# Patient Record
Sex: Female | Born: 1955
Health system: Southern US, Community
[De-identification: ages and names within clinical notes are randomized; demographics above are authoritative.]

## PROBLEM LIST (undated history)

## (undated) DIAGNOSIS — B9681 Helicobacter pylori [H. pylori] as the cause of diseases classified elsewhere: Secondary | ICD-10-CM

## (undated) DIAGNOSIS — M539 Dorsopathy, unspecified: Secondary | ICD-10-CM

## (undated) DIAGNOSIS — N200 Calculus of kidney: Secondary | ICD-10-CM

## (undated) DIAGNOSIS — K279 Peptic ulcer, site unspecified, unspecified as acute or chronic, without hemorrhage or perforation: Secondary | ICD-10-CM

## (undated) HISTORY — PX: FINGER SURGERY: SHX640

## (undated) HISTORY — PX: LITHOTRIPSY: SUR834

---

## 2000-04-18 ENCOUNTER — Encounter (INDEPENDENT_AMBULATORY_CARE_PROVIDER_SITE_OTHER): Payer: Self-pay | Admitting: Specialist

## 2000-04-18 ENCOUNTER — Other Ambulatory Visit: Admission: RE | Admit: 2000-04-18 | Discharge: 2000-04-18 | Payer: Self-pay | Admitting: Obstetrics and Gynecology

## 2000-10-03 ENCOUNTER — Other Ambulatory Visit: Admission: RE | Admit: 2000-10-03 | Discharge: 2000-10-03 | Payer: Self-pay | Admitting: Obstetrics and Gynecology

## 2002-04-09 ENCOUNTER — Other Ambulatory Visit: Admission: RE | Admit: 2002-04-09 | Discharge: 2002-04-09 | Payer: Self-pay | Admitting: Gynecology

## 2007-01-22 ENCOUNTER — Emergency Department (HOSPITAL_COMMUNITY): Admission: EM | Admit: 2007-01-22 | Discharge: 2007-01-22 | Payer: Self-pay | Admitting: Emergency Medicine

## 2010-10-29 LAB — WET PREP, GENITAL
WBC, Wet Prep HPF POC: NONE SEEN
Yeast Wet Prep HPF POC: NONE SEEN

## 2010-10-29 LAB — URINALYSIS, ROUTINE W REFLEX MICROSCOPIC
Glucose, UA: NEGATIVE
Nitrite: NEGATIVE
Protein, ur: NEGATIVE
pH: 5.5

## 2013-02-15 ENCOUNTER — Ambulatory Visit: Payer: Self-pay | Admitting: Family Medicine

## 2013-02-15 DIAGNOSIS — Z0289 Encounter for other administrative examinations: Secondary | ICD-10-CM

## 2013-03-15 ENCOUNTER — Telehealth: Payer: Self-pay

## 2013-03-15 NOTE — Telephone Encounter (Signed)
Left message for call back Non identifiable  New Patient  No pertinent data 

## 2013-03-18 ENCOUNTER — Ambulatory Visit: Payer: Self-pay | Admitting: Family Medicine

## 2013-03-18 DIAGNOSIS — Z0289 Encounter for other administrative examinations: Secondary | ICD-10-CM

## 2013-03-26 NOTE — Telephone Encounter (Signed)
Patient was a no-show 

## 2014-01-01 ENCOUNTER — Ambulatory Visit: Payer: Self-pay | Attending: Physical Medicine and Rehabilitation | Admitting: Rehabilitation

## 2014-03-05 ENCOUNTER — Emergency Department (INDEPENDENT_AMBULATORY_CARE_PROVIDER_SITE_OTHER)
Admission: EM | Admit: 2014-03-05 | Discharge: 2014-03-05 | Disposition: A | Discharge status: Home / Self Care | Payer: Medicare Other | Source: Home / Self Care | Attending: Family Medicine | Admitting: Family Medicine

## 2014-03-05 ENCOUNTER — Encounter: Payer: Self-pay | Admitting: *Deleted

## 2014-03-05 DIAGNOSIS — L299 Pruritus, unspecified: Secondary | ICD-10-CM

## 2014-03-05 HISTORY — DX: Dorsopathy, unspecified: M53.9

## 2014-03-05 MED ORDER — DERMA-SMOOTHE/FS SCALP 0.01 % EX OIL
TOPICAL_OIL | CUTANEOUS | Status: DC
Start: 2014-03-05 — End: 2016-05-10

## 2014-03-05 MED ORDER — SPINOSAD 0.9 % EX SUSP: 1.0000 "application " | Freq: Once | CUTANEOUS | Status: DC

## 2014-03-05 NOTE — Discharge Instructions (Signed)
Apply spinosad to dry hair to completely cover scalp and hair.  Leave on for 10 minutes then rinse completely with warm water.

## 2014-03-05 NOTE — ED Provider Notes (Signed)
CSN: [id number]     Arrival date & time 03/05/14  1318 History   First MD Initiated Contact with Patient 03/05/14 1329     Chief Complaint  Patient presents with  . Head Lice  . Fatigue      HPI Comments: Patient reports [place] and believes that she was exposed to bedbugs.  While staying at another motel in December 2015, she believes that she became infected with scabies and head lice.  She reports that her scabies and bedbug rashes have resolved but she continues to have scalp itching.  She has tried several head lice treatments but complains of continued scalp itching.  She also complains of persistent fatigue.  The history is provided by the patient.    Past Medical History  Diagnosis Date  . Back problem    Past Surgical History  Procedure Laterality Date  . Cesarean section    . Finger surgery     Family History  Problem Relation Age of Onset  . Cancer Mother     pancreatic    History  Substance Use Topics  . Smoking status: Never Smoker   . Smokeless tobacco: Not on file  . Alcohol Use: No   OB History    No data available     Review of Systems  Constitutional: Positive for activity change and fatigue. Negative for fever, chills and unexpected weight change.  HENT:       Scalp itching  Eyes: Negative.   Respiratory: Negative.   Cardiovascular: Negative.   Gastrointestinal: Positive for nausea. Negative for abdominal pain.  Genitourinary: Negative for dysuria, frequency, flank pain and difficulty urinating.  Musculoskeletal: Negative for arthralgias.  Neurological: Positive for light-headedness and headaches. Negative for seizures, syncope, facial asymmetry, speech difficulty and numbness.    Allergies  Augmentin; Biaxin; and Keflet  Home Medications   Prior to Admission medications   Medication Sig Start Date End Date Taking? Authorizing Provider  Fluocinolone Acetonide (DERMA-SMOOTHE/FS SCALP) 0.01 % OIL  Apply to scalp at bedtime; wash scalp and hair next morning.  Use for 3 to 5 days as needed. 03/05/14   Lattie Haw, MD  Spinosad 0.9 % SUSP Apply 1 application topically once. Use as directed. 03/05/14   Lattie Haw, MD   BP 146/74 mmHg  Pulse 74  Temp(Src) 98.3 F (36.8 C) (Oral)  Resp 16  Ht  (1.651 m)  Wt 111 lb (50.349 kg)  BMI 18.47 kg/m2  SpO2 98% Physical Exam Nursing notes and Vital Signs reviewed. Appearance:  Patient appears stated age, and in no acute distress Scalp:  No nits or lice identified.  No scalp lesions or inflammation Eyes:  Pupils are equal, round, and reactive to light and accomodation.  Extraocular movement is intact.  Conjunctivae are not inflamed  Ears:  Canals normal.  Tympanic membranes normal.  Nose:  Normal turbinates.  No sinus tenderness.    Pharynx:  Normal Neck:  Supple.  No adenopathy  Lungs:  Clear to auscultation.  Breath sounds are equal.  Heart:  Regular rate and rhythm without murmurs, rubs, or gallops.  Abdomen:  Nontender without masses or hepatosplenomegaly.  Bowel sounds are present.  No CVA or flank tenderness.  Extremities:  No edema.  No calf tenderness Skin:  No rash present.   ED Course  Procedures none     MDM   1. Pruritus of scalp; ?pedulosis capitis  One application of spinosad.  Derma-smoothe/FS for itching Apply spinosad to dry hair to completely cover scalp and hair.  Leave on for 10 minutes then rinse completely with warm water. Followup with Family Doctor if not improved in about 10 days. Followup with Family Doctor for evaluation of fatigue and abdominal pain    Lattie HawStephen A Lizania Bouchard, MD 03/09/14 [phone]

## 2014-03-05 NOTE — ED Notes (Signed)
Pt reports that she got bed bugs from a motel 11/15', and lice and scabies from a different motel 12/15'. She c/o extreme fatigue since then and yellow nasal d/c. She believes that the bed bugs and scabies have resolved, but that she still has lice. She also c/o LT side abd pain x 3wks.

## 2014-03-11 ENCOUNTER — Ambulatory Visit: Payer: Medicare Other | Admitting: Family Medicine

## 2014-03-11 DIAGNOSIS — Z0289 Encounter for other administrative examinations: Secondary | ICD-10-CM

## 2014-03-18 ENCOUNTER — Ambulatory Visit: Payer: Medicare Other | Admitting: Family Medicine

## 2014-03-18 DIAGNOSIS — Z0289 Encounter for other administrative examinations: Secondary | ICD-10-CM

## 2014-08-25 HISTORY — PX: HAND SURGERY: SHX662

## 2015-03-14 ENCOUNTER — Emergency Department (HOSPITAL_BASED_OUTPATIENT_CLINIC_OR_DEPARTMENT_OTHER)
Admission: EM | Admit: 2015-03-14 | Discharge: 2015-03-14 | Disposition: A | Discharge status: Home / Self Care | Payer: Medicare Other | Attending: Emergency Medicine | Admitting: Emergency Medicine

## 2015-03-14 ENCOUNTER — Encounter (HOSPITAL_BASED_OUTPATIENT_CLINIC_OR_DEPARTMENT_OTHER): Payer: Self-pay | Admitting: *Deleted

## 2015-03-14 ENCOUNTER — Emergency Department (HOSPITAL_BASED_OUTPATIENT_CLINIC_OR_DEPARTMENT_OTHER): Payer: Medicare Other

## 2015-03-14 DIAGNOSIS — R51 Headache: Secondary | ICD-10-CM | POA: Insufficient documentation

## 2015-03-14 DIAGNOSIS — R531 Weakness: Secondary | ICD-10-CM | POA: Diagnosis not present

## 2015-03-14 DIAGNOSIS — M2550 Pain in unspecified joint: Secondary | ICD-10-CM | POA: Diagnosis not present

## 2015-03-14 DIAGNOSIS — R42 Dizziness and giddiness: Secondary | ICD-10-CM | POA: Insufficient documentation

## 2015-03-14 DIAGNOSIS — R519 Headache, unspecified: Secondary | ICD-10-CM

## 2015-03-14 LAB — BASIC METABOLIC PANEL
Anion gap: 7 (ref 5–15)
BUN: 14 mg/dL (ref 6–20)
CO2: 29 mmol/L (ref 22–32)
CREATININE: 0.57 mg/dL (ref 0.44–1.00)
Calcium: 9 mg/dL (ref 8.9–10.3)
Chloride: 104 mmol/L (ref 101–111)
Glucose, Bld: 101 mg/dL — ABNORMAL HIGH (ref 65–99)
POTASSIUM: 4 mmol/L (ref 3.5–5.1)
SODIUM: 140 mmol/L (ref 135–145)

## 2015-03-14 LAB — URINALYSIS, ROUTINE W REFLEX MICROSCOPIC
BILIRUBIN URINE: NEGATIVE
GLUCOSE, UA: NEGATIVE mg/dL
KETONES UR: NEGATIVE mg/dL
LEUKOCYTES UA: NEGATIVE
NITRITE: NEGATIVE
PH: 6.5 (ref 5.0–8.0)
Protein, ur: NEGATIVE mg/dL
Specific Gravity, Urine: 1.015 (ref 1.005–1.030)

## 2015-03-14 LAB — CBC
HCT: 39.9 % (ref 36.0–46.0)
Hemoglobin: 12.9 g/dL (ref 12.0–15.0)
MCH: 28.3 pg (ref 26.0–34.0)
MCHC: 32.3 g/dL (ref 30.0–36.0)
MCV: 87.5 fL (ref 78.0–100.0)
PLATELETS: 257 10*3/uL (ref 150–400)
RBC: 4.56 MIL/uL (ref 3.87–5.11)
RDW: 13.5 % (ref 11.5–15.5)
WBC: 4 10*3/uL (ref 4.0–10.5)

## 2015-03-14 LAB — CBG MONITORING, ED: Glucose-Capillary: 82 mg/dL (ref 65–99)

## 2015-03-14 LAB — URINE MICROSCOPIC-ADD ON
Squamous Epithelial / LPF: NONE SEEN
WBC, UA: NONE SEEN WBC/hpf (ref 0–5)

## 2015-03-14 MED ORDER — MECLIZINE HCL 25 MG PO TABS: 25.0000 mg | ORAL_TABLET | Freq: Three times a day (TID) | ORAL | Status: DC | PRN

## 2015-03-14 MED ORDER — HYDROCODONE-ACETAMINOPHEN 5-325 MG PO TABS: 1.0000 | ORAL_TABLET | Freq: Four times a day (QID) | ORAL | Status: DC | PRN

## 2015-03-14 NOTE — ED Notes (Signed)
FAST assessment: negative 

## 2015-03-14 NOTE — ED Provider Notes (Signed)
CSN: [id number]     Arrival date & time 03/14/15  0805 History   First MD Initiated Contact with Patient 03/14/15 0815     Chief Complaint  Patient presents with  . Dizziness     (Consider location/radiation/quality/duration/timing/severity/associated sxs/prior Treatment) Patient is a 60 y.o. female presenting with dizziness. The history is provided by the patient.  Dizziness Associated symptoms: headaches and weakness   Associated symptoms: no chest pain, no nausea, no shortness of breath and no vomiting    patient with a 4 day history of dizziness that's been getting a little bit worse associated with some mild vertigo. For head headache no fevers. Also some right arm weakness that's been ongoing for months but she's had surgery on that side. Patient with some concerns about possible stroke. FAST exam in triage was negative.  Past Medical History  Diagnosis Date  . Back problem    Past Surgical History  Procedure Laterality Date  . Cesarean section    . Finger surgery    . Hand surgery  august 2016   Family History  Problem Relation Age of Onset  . Cancer Mother     pancreatic    Social History  Substance Use Topics  . Smoking status: Never Smoker   . Smokeless tobacco: None  . Alcohol Use: No   OB History    No data available     Review of Systems  Constitutional: Negative for fever.  HENT: Negative for congestion.   Eyes: Negative for visual disturbance.  Respiratory: Negative for shortness of breath.   Cardiovascular: Negative for chest pain.  Gastrointestinal: Negative for nausea, vomiting and abdominal pain.  Genitourinary: Negative for dysuria.  Musculoskeletal: Positive for arthralgias.  Skin: Negative for rash.  Neurological: Positive for dizziness, weakness and headaches.  Hematological: Does not bruise/bleed easily.  Psychiatric/Behavioral: Negative for confusion.      Allergies  Augmentin; Biaxin; and Keflet  Home Medications   Prior to  Admission medications   Medication Sig Start Date End Date Taking? Authorizing Provider  Fluocinolone Acetonide (DERMA-SMOOTHE/FS SCALP) 0.01 % OIL Apply to scalp at bedtime; wash scalp and hair next morning.  Use for 3 to 5 days as needed. 03/05/14   Lattie Haw, MD  HYDROcodone-acetaminophen (NORCO/VICODIN) 5-325 MG tablet Take 1-2 tablets by mouth every 6 (six) hours as needed for moderate pain. 03/14/15   Vanetta Mulders, MD  meclizine (ANTIVERT) 25 MG tablet Take 1 tablet (25 mg total) by mouth 3 (three) times daily as needed for dizziness. 03/14/15   Vanetta Mulders, MD  Spinosad 0.9 % SUSP Apply 1 application topically once. Use as directed. 03/05/14   Lattie Haw, MD   BP 139/76 mmHg  Pulse 62  Temp(Src) 97.8 F (36.6 C) (Oral)  Resp 16  SpO2 100% Physical Exam  Constitutional: She is oriented to person, place, and time. She appears well-developed and well-nourished. No distress.  HENT:  Head: Normocephalic and atraumatic.  Right Ear: External ear normal.  Left Ear: External ear normal.  Mouth/Throat: Oropharynx is clear and moist. No oropharyngeal exudate.  Eyes: Conjunctivae and EOM are normal. Pupils are equal, round, and reactive to light.  Neck: Normal range of motion. Neck supple.  Cardiovascular: Normal rate, regular rhythm and normal heart sounds.   Pulmonary/Chest: Effort normal and breath sounds normal. No respiratory distress.  Abdominal: Soft. Bowel sounds are normal. There is no tenderness.  Musculoskeletal: Normal range of motion. She exhibits no edema.  Neurological: She is alert and  oriented to person, place, and time. No cranial nerve deficit. She exhibits normal muscle tone. Coordination normal.  Skin: Skin is warm. No rash noted.  Nursing note and vitals reviewed.   ED Course  Procedures (including critical care time) Labs Review Labs Reviewed  BASIC METABOLIC PANEL - Abnormal; Notable for the following:    Glucose, Bld 101 (*)    All other  components within normal limits  URINALYSIS, ROUTINE W REFLEX MICROSCOPIC (NOT AT [facility]) - Abnormal; Notable for the following:    Hgb urine dipstick TRACE (*)    All other components within normal limits  URINE MICROSCOPIC-ADD ON - Abnormal; Notable for the following:    Bacteria, UA FEW (*)    All other components within normal limits  CBC  CBG MONITORING, ED   Results for orders placed or performed during the hospital encounter of 03/14/15  Basic metabolic panel  Result Value Ref Range   Sodium 140 135 - 145 mmol/L   Potassium 4.0 3.5 - 5.1 mmol/L   Chloride 104 101 - 111 mmol/L   CO2 29 22 - 32 mmol/L   Glucose, Bld 101 (H) 65 - 99 mg/dL   BUN 14 6 - 20 mg/dL   Creatinine, Ser 4.09 0.44 - 1.00 mg/dL   Calcium 9.0 8.9 - 81.1 mg/dL   GFR calc non Af Amer >60 >60 mL/min   GFR calc Af Amer >60 >60 mL/min   Anion gap 7 5 - 15  CBC  Result Value Ref Range   WBC 4.0 4.0 - 10.5 K/uL   RBC 4.56 3.87 - 5.11 MIL/uL   Hemoglobin 12.9 12.0 - 15.0 g/dL   HCT 91.4 78.2 - 95.6 %   MCV 87.5 78.0 - 100.0 fL   MCH 28.3 26.0 - 34.0 pg   MCHC 32.3 30.0 - 36.0 g/dL   RDW 21.3 08.6 - 57.8 %   Platelets 257 150 - 400 K/uL  Urinalysis, Routine w reflex microscopic (not at [facility])  Result Value Ref Range   Color, Urine YELLOW YELLOW   APPearance CLEAR CLEAR   Specific Gravity, Urine 1.015 1.005 - 1.030   pH 6.5 5.0 - 8.0   Glucose, UA NEGATIVE NEGATIVE mg/dL   Hgb urine dipstick TRACE (A) NEGATIVE   Bilirubin Urine NEGATIVE NEGATIVE   Ketones, ur NEGATIVE NEGATIVE mg/dL   Protein, ur NEGATIVE NEGATIVE mg/dL   Nitrite NEGATIVE NEGATIVE   Leukocytes, UA NEGATIVE NEGATIVE  Urine microscopic-add on  Result Value Ref Range   Squamous Epithelial / LPF NONE SEEN NONE SEEN   WBC, UA NONE SEEN 0 - 5 WBC/hpf   RBC / HPF 0-5 0 - 5 RBC/hpf   Bacteria, UA FEW (A) NONE SEEN  CBG monitoring, ED  Result Value Ref Range   Glucose-Capillary 82 65 - 99 mg/dL     Imaging Review Dg Chest 2  View  03/14/2015  CLINICAL DATA:  Dizziness for 3 days EXAM: CHEST  2 VIEW COMPARISON:  01/06/2014 FINDINGS: Normal heart size. Clear lungs. No pneumothorax. No pleural effusion. Hyperaeration. Right humeral neck fracture is grossly healed with deformity. IMPRESSION: No active cardiopulmonary disease. Electronically Signed   By: Jolaine Click M.D.   On: 03/14/2015 09:04   Ct Head Wo Contrast  03/14/2015  CLINICAL DATA:  Pt complains of dizziness x 3 days, headache, some nausea No hx HTN EXAM: CT HEAD WITHOUT CONTRAST TECHNIQUE: Contiguous axial images were obtained from the base of the skull through the vertex without intravenous contrast.  COMPARISON:  None. FINDINGS: Ventricles are normal in size and configuration. There are no parenchymal masses or mass effect, no evidence of an infarct, no extra-axial masses or abnormal fluid collections and no intracranial hemorrhage. Visualized sinuses and mastoid air cells are clear. No skull lesion. IMPRESSION: Normal unenhanced CT scan of the brain. Electronically Signed   By:Amie Portlandmond M.D.   On: 03/14/2015 08:58   Mr Brain Wo Contrast  03/14/2015  CLINICAL DATA:  Dizziness. EXAM: MRI HEAD WITHOUT CONTRAST TECHNIQUE: Multiplanar, multiecho pulse sequences of the brain and surrounding structures were obtained without intravenous contrast. COMPARISON:  CT head 03/14/2015 FINDINGS: Negative for acute infarct. No significant chronic ischemic changes. Negative for intracranial hemorrhage. Negative for mass or edema. No shift of the midline structures. Ventricle size normal.  Cerebral volume normal. Craniocervical junction normal. Normal pituitary. Vessels of the base the brain are patent. Normal skull base. Paranasal sinuses clear.  Normal orbit. IMPRESSION: Negative Electronically Signed   By: Marlan Palau M.D.   On: 03/14/2015 10:52   I have personally reviewed and evaluated these images and lab results as part of my medical decision-making.   EKG  Interpretation   Date/Time:  Saturday March 14 2015 08:14:57 EST Ventricular Rate:  67 PR Interval:  132 QRS Duration: 85 QT Interval:  401 QTC Calculation: 423 R Axis:   72 Text Interpretation:  Sinus rhythm No previous ECGs available Confirmed by  Doriann Zuch  MD, Rodd Heft ([id number]) on 03/14/2015 8:18:07 AM      MDM   Final diagnoses:  Dizziness  Nonintractable headache, unspecified chronicity pattern, unspecified headache type    Patient with dizziness slight room spinning the past 4 days. Associated with some for head headache. No fevers no nausea no vomiting. Patient also with some weakness to her right arm which she's not sure is related to of recent surgery. That's been going on for several months. Patient was concerned about possible stroke.  Head CT was negative MRI of the brain is negative. Labs without any significant abnormalities. Will treat her symptoms with meclizine and a short the course of hydrocodone. Patient will follow-up with her regular doctor if symptoms do not improve.  Neuro exam was normal ear exam was normal.  Vanetta Mulders, MD 03/14/15 1124

## 2015-03-14 NOTE — Discharge Instructions (Signed)
Workup for the dizziness without any acute findings. MRI was negative. Trial of meclizine for the dizziness. And hydrocodone for the headache. If symptoms do not improve make an appointment to follow-up with your regular doctor. Return for any new or worse symptoms.

## 2015-03-14 NOTE — ED Notes (Signed)
Patient transported to MRI 

## 2015-03-14 NOTE — ED Notes (Signed)
MD at bedside. 

## 2015-03-14 NOTE — ED Notes (Signed)
Pt states she has been having some dizziness for the past few days

## 2015-03-14 NOTE — ED Notes (Signed)
Pt placed on cont cardiac monitor with int NBP and cont POX

## 2015-03-14 NOTE — ED Notes (Signed)
Patient transported to X-ray via stretcher, sr x 2 up 

## 2015-03-26 ENCOUNTER — Emergency Department (HOSPITAL_BASED_OUTPATIENT_CLINIC_OR_DEPARTMENT_OTHER)
Admission: EM | Admit: 2015-03-26 | Discharge: 2015-03-27 | Disposition: A | Discharge status: Home / Self Care | Payer: Medicare Other | Attending: Emergency Medicine | Admitting: Emergency Medicine

## 2015-03-26 ENCOUNTER — Other Ambulatory Visit: Payer: Self-pay

## 2015-03-26 ENCOUNTER — Encounter (HOSPITAL_BASED_OUTPATIENT_CLINIC_OR_DEPARTMENT_OTHER): Payer: Self-pay | Admitting: Emergency Medicine

## 2015-03-26 ENCOUNTER — Emergency Department (HOSPITAL_BASED_OUTPATIENT_CLINIC_OR_DEPARTMENT_OTHER): Payer: Medicare Other

## 2015-03-26 DIAGNOSIS — Z79899 Other long term (current) drug therapy: Secondary | ICD-10-CM | POA: Insufficient documentation

## 2015-03-26 DIAGNOSIS — Z8739 Personal history of other diseases of the musculoskeletal system and connective tissue: Secondary | ICD-10-CM | POA: Diagnosis not present

## 2015-03-26 DIAGNOSIS — R079 Chest pain, unspecified: Secondary | ICD-10-CM | POA: Diagnosis not present

## 2015-03-26 DIAGNOSIS — R091 Pleurisy: Secondary | ICD-10-CM | POA: Diagnosis present

## 2015-03-26 NOTE — ED Notes (Signed)
Patient states that she started to have pain to her right chest yesterday. She went to an urgent care but the wait was too long. She reports that she gets this yearly and it is usually bronchitis.

## 2015-03-27 MED ORDER — AZITHROMYCIN 250 MG PO TABS: 250.0000 mg | ORAL_TABLET | Freq: Every day | ORAL | Status: DC

## 2015-03-27 MED ORDER — IBUPROFEN 800 MG PO TABS: 800.0000 mg | ORAL_TABLET | Freq: Three times a day (TID) | ORAL | Status: DC

## 2015-03-27 MED ORDER — RANITIDINE HCL 150 MG PO TABS: 150.0000 mg | ORAL_TABLET | Freq: Two times a day (BID) | ORAL | Status: DC

## 2015-03-27 NOTE — Discharge Instructions (Signed)

## 2015-03-27 NOTE — ED Notes (Signed)
Pt verbalizes understanding of d/c instructions and denies any further needs at this time. 

## 2015-03-27 NOTE — ED Provider Notes (Signed)
CSN: [id number]     Arrival date & time 03/26/15  2224 History   First MD Initiated Contact with Patient 03/26/15 2352     Chief Complaint  Patient presents with  . Pleurisy     (Consider location/radiation/quality/duration/timing/severity/associated sxs/prior Treatment) HPI Comments: The pt is a 60 y/o female - she has a history of no significant cardiac or pulmonary processes / problems. - she reports that over the last couple of days she has had r sided sharp CP which is anterior - pinpointed, lasts several seconds and goes away - better with laying down and deep berathing - nothing makes better - no exertional sx and no sx at this time - she has had some recent URI sx with phlegm in the back of the throat.  The history is provided by the patient.    Past Medical History  Diagnosis Date  . Back problem    Past Surgical History  Procedure Laterality Date  . Cesarean section    . Finger surgery    . Hand surgery  august 2016   Family History  Problem Relation Age of Onset  . Cancer Mother     pancreatic    Social History  Substance Use Topics  . Smoking status: Never Smoker   . Smokeless tobacco: None  . Alcohol Use: No   OB History    No data available     Review of Systems  All other systems reviewed and are negative.     Allergies  Augmentin; Avelox; Biaxin; and Keflet  Home Medications   Prior to Admission medications   Medication Sig Start Date End Date Taking? Authorizing Provider  azithromycin (ZITHROMAX Z-PAK) 250 MG tablet Take 1 tablet (250 mg total) by mouth daily. 500mg  PO day 1, then 250mg  PO days 205 03/27/15   Eber HongBrian Revecca Nachtigal, MD  Fluocinolone Acetonide (DERMA-SMOOTHE/FS SCALP) 0.01 % OIL Apply to scalp at bedtime; wash scalp and hair next morning.  Use for 3 to 5 days as needed. 03/05/14   Lattie HawStephen A Beese, MD  HYDROcodone-acetaminophen (NORCO/VICODIN) 5-325 MG tablet Take 1-2 tablets by mouth every 6 (six) hours as needed for moderate pain. 03/14/15    Vanetta MuldersScott Zackowski, MD  ibuprofen (ADVIL,MOTRIN) 800 MG tablet Take 1 tablet (800 mg total) by mouth 3 (three) times daily. 03/27/15   Eber HongBrian Kaven Cumbie, MD  meclizine (ANTIVERT) 25 MG tablet Take 1 tablet (25 mg total) by mouth 3 (three) times daily as needed for dizziness. 03/14/15   Vanetta MuldersScott Zackowski, MD  ranitidine (ZANTAC) 150 MG tablet Take 1 tablet (150 mg total) by mouth 2 (two) times daily. 03/27/15   Eber HongBrian Berdine Rasmusson, MD  Spinosad 0.9 % SUSP Apply 1 application topically once. Use as directed. 03/05/14   Lattie HawStephen A Beese, MD   BP 115/70 mmHg  Pulse 64  Temp(Src) 98 F (36.7 C) (Oral)  Resp 18  Ht 5\' 6"  (1.676 m)  Wt 110 lb (49.896 kg)  BMI 17.76 kg/m2  SpO2 100% Physical Exam  Constitutional: She appears well-developed and well-nourished. No distress.  HENT:  Head: Normocephalic and atraumatic.  Mouth/Throat: Oropharynx is clear and moist. No oropharyngeal exudate.  Eyes: Conjunctivae and EOM are normal. Pupils are equal, round, and reactive to light. Right eye exhibits no discharge. Left eye exhibits no discharge. No scleral icterus.  Neck: Normal range of motion. Neck supple. No JVD present. No thyromegaly present.  Cardiovascular: Normal rate, regular rhythm, normal heart sounds and intact distal pulses.  Exam reveals no gallop and  no friction rub.   No murmur heard. Pulmonary/Chest: Effort normal and breath sounds normal. No respiratory distress. She has no wheezes. She has no rales.  Abdominal: Soft. Bowel sounds are normal. She exhibits no distension and no mass. There is no tenderness.  Musculoskeletal: Normal range of motion. She exhibits no edema or tenderness.  Lymphadenopathy:    She has no cervical adenopathy.  Neurological: She is alert. Coordination normal.  Skin: Skin is warm and dry. No rash noted. No erythema.  Psychiatric: She has a normal mood and affect. Her behavior is normal.  Nursing note and vitals reviewed.   ED Course  Procedures (including critical care time) Labs  Review Labs Reviewed - No data to display  Imaging Review Dg Chest 2 View  03/26/2015  CLINICAL DATA:  Right upper chest pain x 2 days; states that this is how bronchitis usually starts with her EXAM: CHEST  2 VIEW COMPARISON:  03/14/2015 FINDINGS: Normal heart, mediastinum and hila. Clear lungs.  No pleural effusion or pneumothorax. Old healed right proximal humerus fracture. IMPRESSION: No active cardiopulmonary disease. Electronically Signed   By: Amie Portland M.D.   On: 03/26/2015 23:02   I have personally reviewed and evaluated these images and lab results as part of my medical decision-making.    MDM   Final diagnoses:  Chest pain, unspecified chest pain type    Lungs are totally clear - no murmur or rubs - no sx here - normal VS.  Filed Vitals:   03/26/15 2229  BP: 115/70  Pulse: 64  Temp: 98 F (36.7 C)  TempSrc: Oral  Resp: 18  Height:  (1.676 m)  Weight: 110 lb (49.896 kg)  SpO2: 100%    CXR seen and viewed and interpreted - there is no pna, no ptx, pt informed Likely pleurisy - no cardiac or PE RF's Pt in agreement with tx plan.  Meds given in ED:  Medications - No data to display  New Prescriptions   AZITHROMYCIN (ZITHROMAX Z-PAK) 250 MG TABLET    Take 1 tablet (250 mg total) by mouth daily.  PO day 1, then  PO days 205   IBUPROFEN (ADVIL,MOTRIN) 800 MG TABLET    Take 1 tablet (800 mg total) by mouth 3 (three) times daily.   RANITIDINE (ZANTAC) 150 MG TABLET    Take 1 tablet (150 mg total) by mouth 2 (two) times daily.      Eber Hong, MD 03/27/15 [phone]

## 2015-04-18 ENCOUNTER — Encounter (HOSPITAL_BASED_OUTPATIENT_CLINIC_OR_DEPARTMENT_OTHER): Payer: Self-pay | Admitting: Emergency Medicine

## 2015-04-18 ENCOUNTER — Emergency Department (HOSPITAL_BASED_OUTPATIENT_CLINIC_OR_DEPARTMENT_OTHER)
Admission: EM | Admit: 2015-04-18 | Discharge: 2015-04-18 | Disposition: A | Discharge status: Home / Self Care | Payer: Medicare Other | Attending: Emergency Medicine | Admitting: Emergency Medicine

## 2015-04-18 DIAGNOSIS — H9202 Otalgia, left ear: Secondary | ICD-10-CM | POA: Insufficient documentation

## 2015-04-18 NOTE — ED Notes (Signed)
Patient states that she no longer wants to wait. The patient walked out

## 2015-04-18 NOTE — ED Notes (Signed)
Patient has pain to her left ear starting last night

## 2015-06-06 ENCOUNTER — Emergency Department (HOSPITAL_BASED_OUTPATIENT_CLINIC_OR_DEPARTMENT_OTHER): Payer: Medicare Other

## 2015-06-06 ENCOUNTER — Encounter (HOSPITAL_BASED_OUTPATIENT_CLINIC_OR_DEPARTMENT_OTHER): Payer: Self-pay | Admitting: *Deleted

## 2015-06-06 ENCOUNTER — Emergency Department (HOSPITAL_BASED_OUTPATIENT_CLINIC_OR_DEPARTMENT_OTHER)
Admission: EM | Admit: 2015-06-06 | Discharge: 2015-06-06 | Disposition: A | Discharge status: Home / Self Care | Payer: Medicare Other | Attending: Emergency Medicine | Admitting: Emergency Medicine

## 2015-06-06 DIAGNOSIS — R070 Pain in throat: Secondary | ICD-10-CM | POA: Insufficient documentation

## 2015-06-06 DIAGNOSIS — J029 Acute pharyngitis, unspecified: Secondary | ICD-10-CM | POA: Diagnosis present

## 2015-06-06 HISTORY — DX: Peptic ulcer, site unspecified, unspecified as acute or chronic, without hemorrhage or perforation: K27.9

## 2015-06-06 HISTORY — DX: Helicobacter pylori (H. pylori) as the cause of diseases classified elsewhere: B96.81

## 2015-06-06 LAB — COMPREHENSIVE METABOLIC PANEL
ALK PHOS: 78 U/L (ref 38–126)
ALT: 16 U/L (ref 14–54)
AST: 29 U/L (ref 15–41)
Albumin: 3.7 g/dL (ref 3.5–5.0)
Anion gap: 7 (ref 5–15)
BUN: 13 mg/dL (ref 6–20)
CALCIUM: 8.9 mg/dL (ref 8.9–10.3)
CHLORIDE: 103 mmol/L (ref 101–111)
CO2: 29 mmol/L (ref 22–32)
Creatinine, Ser: 0.58 mg/dL (ref 0.44–1.00)
GFR calc Af Amer: >60 mL/min (ref >60)
GFR calc non Af Amer: >60 mL/min (ref >60)
GLUCOSE: 106 mg/dL — AB (ref 65–99)
Potassium: 3.6 mmol/L (ref 3.5–5.1)
SODIUM: 139 mmol/L (ref 135–145)
Total Bilirubin: 0.4 mg/dL (ref 0.3–1.2)
Total Protein: 6.7 g/dL (ref 6.5–8.1)

## 2015-06-06 LAB — CBC WITH DIFFERENTIAL/PLATELET
Basophils Absolute: 0 10*3/uL (ref 0.0–0.1)
Basophils Relative: 0 %
EOS ABS: 0.7 10*3/uL (ref 0.0–0.7)
EOS PCT: 12 %
HCT: 39.1 % (ref 36.0–46.0)
Hemoglobin: 13.2 g/dL (ref 12.0–15.0)
LYMPHS ABS: 2.1 10*3/uL (ref 0.7–4.0)
LYMPHS PCT: 37 %
MCH: 29.6 pg (ref 26.0–34.0)
MCHC: 33.8 g/dL (ref 30.0–36.0)
MCV: 87.7 fL (ref 78.0–100.0)
MONOS PCT: 6 %
Monocytes Absolute: 0.4 10*3/uL (ref 0.1–1.0)
Neutro Abs: 2.5 10*3/uL (ref 1.7–7.7)
Neutrophils Relative %: 45 %
PLATELETS: 263 10*3/uL (ref 150–400)
RBC: 4.46 MIL/uL (ref 3.87–5.11)
RDW: 14 % (ref 11.5–15.5)
WBC: 5.6 10*3/uL (ref 4.0–10.5)

## 2015-06-06 LAB — LIPASE, BLOOD: Lipase: 29 U/L (ref 11–51)

## 2015-06-06 MED ORDER — PANTOPRAZOLE SODIUM 20 MG PO TBEC: 20.0000 mg | DELAYED_RELEASE_TABLET | Freq: Every day | ORAL | Status: DC

## 2015-06-06 MED ORDER — IOPAMIDOL (ISOVUE-300) INJECTION 61%
80.0000 mL | Freq: Once | INTRAVENOUS | Status: AC | PRN
Start: 2015-06-06 — End: 2015-06-06
  Administered 2015-06-06: 100 mL via INTRAVENOUS

## 2015-06-06 NOTE — ED Notes (Signed)
Throat pain and pressure x 2-3 months. Voice clear, states she is able to swallow

## 2015-06-06 NOTE — ED Provider Notes (Signed)
CSN: [id number]     Arrival date & time 06/06/15  1710 History  By signing my name below, I, Phillis Haggis, attest that this documentation has been prepared under the direction and in the presence of Arby Barrette, MD. Electronically Signed: Phillis Haggis, ED Scribe. 06/06/2015. 6:24 PM.  Chief Complaint  Patient presents with  . Sore Throat   The history is provided by the patient. No language interpreter was used.  HPI Comments: Sherry Randall is a 60 y.o. female with a hx of H. Pylori ulcer who presents to the Emergency Department complaining of gradually worsening sore throat and pressure onset 3 months ago. She states "it feels like something pressing and pain. It's like a fist in there." She reports worsening pain with talking. She reports associated bilateral leg cramping two weeks ago, chills over the past two weeks and states, "I don't feel like myself." She was seen in the ED weeks ago for the same with negative results. She also saw a doctor last week who told her to follow up if anything worsened without any definitive results.She has not seen an ENT for this issue. Pt has been previously been biopsied for cancer. Pt states that she began taking 3 medications today for H. Pylori, including Levaquin.  She denies chest pain, SOB, fever, oropharyngeal swelling, nausea, or vomiting. Pt denies allergies to contrast dye.   Past Medical History  Diagnosis Date  . Back problem   . H pylori ulcer    Past Surgical History  Procedure Laterality Date  . Cesarean section    . Finger surgery    . Hand surgery  august 2016   Family History  Problem Relation Age of Onset  . Cancer Mother     pancreatic    Social History  Substance Use Topics  . Smoking status: Never Smoker   . Smokeless tobacco: None  . Alcohol Use: No   OB History    No data available     Review of Systems 10 Systems reviewed and all are negative for acute change except as noted in the HPI.  Allergies   Augmentin; Avelox; Biaxin; and Keflet  Home Medications   Prior to Admission medications   Medication Sig Start Date End Date Taking? Authorizing Provider  azithromycin (ZITHROMAX Z-PAK) 250 MG tablet Take 1 tablet (250 mg total) by mouth daily. 500mg  PO day 1, then 250mg  PO days 205 03/27/15   Eber Hong, MD  Fluocinolone Acetonide (DERMA-SMOOTHE/FS SCALP) 0.01 % OIL Apply to scalp at bedtime; wash scalp and hair next morning.  Use for 3 to 5 days as needed. 03/05/14   Lattie Haw, MD  HYDROcodone-acetaminophen (NORCO/VICODIN) 5-325 MG tablet Take 1-2 tablets by mouth every 6 (six) hours as needed for moderate pain. 03/14/15   Vanetta Mulders, MD  ibuprofen (ADVIL,MOTRIN) 800 MG tablet Take 1 tablet (800 mg total) by mouth 3 (three) times daily. 03/27/15   Eber Hong, MD  meclizine (ANTIVERT) 25 MG tablet Take 1 tablet (25 mg total) by mouth 3 (three) times daily as needed for dizziness. 03/14/15   Vanetta Mulders, MD  pantoprazole (PROTONIX) 20 MG tablet Take 1 tablet (20 mg total) by mouth daily. 06/06/15   Arby Barrette, MD  ranitidine (ZANTAC) 150 MG tablet Take 1 tablet (150 mg total) by mouth 2 (two) times daily. 03/27/15   Eber Hong, MD  Spinosad 0.9 % SUSP Apply 1 application topically once. Use as directed. 03/05/14   Lattie Haw, MD  BP 109/93 mmHg  Pulse 63  Temp(Src) 98.1 F (36.7 C) (Oral)  Resp 18  Ht 5\' 6"  (1.676 m)  Wt 109 lb (49.442 kg)  BMI 17.60 kg/m2  SpO2 100% Physical Exam  Constitutional: She is oriented to person, place, and time. She appears well-developed and well-nourished.  Non-toxic appearance.  HENT:  Head: Normocephalic and atraumatic.  Mouth/Throat: Oropharynx is clear and moist.  Eyes: Conjunctivae and EOM are normal. Pupils are equal, round, and reactive to light. No scleral icterus.  Neck: Normal range of motion. Neck supple. No thyromegaly present.  Cardiovascular: Normal rate, regular rhythm, normal heart sounds and intact distal pulses.   Exam reveals no gallop and no friction rub.   No murmur heard. Pulmonary/Chest: Effort normal and breath sounds normal. No respiratory distress. She has no wheezes. She has no rales.  Abdominal: Soft. She exhibits no mass. There is tenderness in the epigastric area. There is no rebound and no guarding.  Moderate epigastric tenderness  Musculoskeletal: Normal range of motion. She exhibits no edema.  No peripheral edema  Lymphadenopathy:    She has no cervical adenopathy.  Neurological: She is alert and oriented to person, place, and time. She has normal reflexes. She displays normal reflexes. No cranial nerve deficit. She exhibits normal muscle tone. Coordination normal.  Movements purposeful and symmetric  Skin: Skin is warm and dry.  Psychiatric: She has a normal mood and affect. Her behavior is normal.  Nursing note and vitals reviewed.   ED Course  Procedures (including critical care time) DIAGNOSTIC STUDIES: Oxygen Saturation is 100% on RA, normal by my interpretation.    COORDINATION OF CARE: 6:22 PM-Discussed treatment plan which includes CT scan with pt at bedside and pt agreed to plan.    Labs Review Labs Reviewed  COMPREHENSIVE METABOLIC PANEL - Abnormal; Notable for the following:    Glucose, Bld 106 (*)    All other components within normal limits  LIPASE, BLOOD  CBC WITH DIFFERENTIAL/PLATELET    Imaging Review Ct Soft Tissue Neck W Contrast  06/06/2015  CLINICAL DATA:  Sore throat and dysphagia. Symptoms began 3 months ago. EXAM: CT NECK WITH CONTRAST TECHNIQUE: Multidetector CT imaging of the neck was performed using the standard protocol following the bolus administration of intravenous contrast. CONTRAST:  100mL ISOVUE-300 IOPAMIDOL (ISOVUE-300) INJECTION 61% COMPARISON:  Cervical spine MRI 04/03/2015. FINDINGS: Pharynx and larynx: No mucosal or submucosal lesion. Salivary glands: Parotid and submandibular glands are normal. Thyroid: Slight heterogeneity the, more  affecting the right lobe than the left. Possible nodule lower pole on the right measuring 13 mm. Other smaller nodules bilaterally. Lymph nodes: No enlarged or low-density nodes on either side of the neck. Vascular: Arterial and venous structures are patent and normal Limited intracranial: Normal Visualized orbits: Normal Mastoids and visualized paranasal sinuses: Clear Skeleton: Ordinary cervical spondylosis. No anterior osteophytes to explain dysphagia. Upper chest: Normal IMPRESSION: No abnormality to explain the presenting symptoms. 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 Consider further evaluation with thyroid ultrasound. If patient is clinically hyperthyroid, consider nuclear medicine thyroid uptake and scan. Electronically Signed   By: Paulina FusiMark  Shogry M.D.   On: 06/06/2015 20:42   I have personally reviewed and evaluated these images and lab results as part of my medical decision-making.   EKG Interpretation None      MDM   Final diagnoses:  Throat pain in adult   Patient has had ongoing throat pain now for almost 4  months. She reports that it's an intense pressure and heart more that she talks. She is not having difficulty breathing. Due to the longevity of her symptoms, CT of the neck was obtained. There is no soft tissue masses or evident compression. Still, considering the patient's symptoms she is strongly counseled to follow-up with ENT for further evaluation. Also she describes a fairly significant history of gastritis and possible GERD. It is possible this is contributing to her throat symptoms. Patient will be started on Protonix and instructed to follow-up with her family physician as well.  Arby Barrette, MD 06/06/15 2121

## 2015-06-06 NOTE — Discharge Instructions (Signed)
Sore Throat A sore throat is pain, burning, irritation, or scratchiness of the throat. There is often pain or tenderness when swallowing or talking. A sore throat may be accompanied by other symptoms, such as coughing, sneezing, fever, and swollen neck glands. A sore throat is often the first sign of another sickness, such as a cold, flu, strep throat, or mononucleosis (commonly known as mono). Most sore throats go away without medical treatment. CAUSES  The most common causes of a sore throat include:  A viral infection, such as a cold, flu, or mono.  A bacterial infection, such as strep throat, tonsillitis, or whooping cough.  Seasonal allergies.  Dryness in the air.  Irritants, such as smoke or pollution.  Gastroesophageal reflux disease (GERD). HOME CARE INSTRUCTIONS   Only take over-the-counter medicines as directed by your caregiver.  Drink enough fluids to keep your urine clear or pale yellow.  Rest as needed.  Try using throat sprays, lozenges, or sucking on hard candy to ease any pain (if older than 4 years or as directed).  Sip warm liquids, such as broth, herbal tea, or warm water with honey to relieve pain temporarily. You may also eat or drink cold or frozen liquids such as frozen ice pops.  Gargle with salt water (mix 1 tsp salt with 8 oz of water).  Do not smoke and avoid secondhand smoke.  Put a cool-mist humidifier in your bedroom at night to moisten the air. You can also turn on a hot shower and sit in the bathroom with the door closed for 5-10 minutes. SEEK IMMEDIATE MEDICAL CARE IF:  You have difficulty breathing.  You are unable to swallow fluids, soft foods, or your saliva.  You have increased swelling in the throat.  Your sore throat does not get better in 7 days.  You have nausea and vomiting.  You have a fever or persistent symptoms for more than 2-3 days.  You have a fever and your symptoms suddenly get worse. MAKE SURE YOU:   Understand  these instructions.  Will watch your condition.  Will get help right away if you are not doing well or get worse.   This information is not intended to replace advice given to you by your health care provider. Make sure you discuss any questions you have with your health care provider.   Document Released: 02/18/2004 Document Revised: 01/31/2014 Document Reviewed: 09/18/2011 Elsevier Interactive Patient Education 2016 [employer] Inc[employer]  Laryngoscopy This procedure can be done by an Ear, Nose, Throat specialist. Call Dr. Emeline Darling as listed above to schedule a recheck for your ongoing throat pain. A laryngoscopy is a procedure performed to view the back of the throat, vocal cords, and voice box (larynx). It may be done in order to figure out why a person has:  A cough.  Voice changes, such as new hoarseness.  Throat pain.  Problems swallowing. During a laryngoscopy, tissue samples (biopsies) can be taken or foreign bodies can be removed. A laryngoscopy can be done in the caregiver's office. It may be performed with a hand-held mirror, or it may require the use of a fiberoptic scope. Under some circumstances, it may be done in a hospital with medicine to help you sleep (general anesthesia). LET YOUR CAREGIVER KNOW ABOUT:   Allergies.  Medicines taken, including herbs, eyedrops, over-the-counter medicines, and creams.  Use of steroids (by mouth or creams).  Previous problems with anesthetics or numbing medicines.  History of bleeding or blood problems.  History of blood  clots.  Possibility of pregnancy, if this applies.  Previous surgery.  Other health problems. RISKS AND COMPLICATIONS   Pain.  Gagging.  Vomiting.  Swelling.  Bleeding.  Problems from anesthesia. BEFORE THE PROCEDURE  Several days before the procedure, you may have blood tests to make sure the blood clots normally. You may be asked to stop taking blood thinners, aspirin, and/or nonsteroidal  anti-inflammatory drugs (NSAIDs) before the procedure. Do not give aspirin to children. If general anesthesia is going to be used, you will usually be asked to stop eating and drinking at least 8 hours before the procedure. Have someone go with you to the procedure in order to drive you home afterward. PROCEDURE  If you are having the procedure in the caregiver's office, it is usually performed while sitting up in a special exam chair with a headrest. A numbing medicine will be sprayed into the mouth and on the back of the throat. Your caregiver will use a gauze pad to hold the tongue out of the way. A mirror will be held at the back of the throat to allow your caregiver to see down the throat. You may be asked to make certain sounds so that vocal cord movement can be observed. If a fiberoptic scope is being used, it will be inserted into either the nose or the mouth and slipped into the throat. Your caregiver can look through an eyepiece or can see an image projected on a monitor. Pieces of tissue can be taken (biopsies) or foreign bodies removed. If you need to have the procedure performed under general anesthesia, you will be lying down on a special operating table. The same kinds of procedures will be followed, but you will not be aware of them. AFTER THE PROCEDURE   When laryngoscopy is done with only local numbing, it usually does not require any changes to your activity level after the procedure is complete.  You may have a sore throat.  You may be asked to rest the voice for some length of time after the procedure.  Do not smoke.  Follow your caregiver's directions regarding eating and drinking after the procedure.  If you had a biopsy taken, your caregiver may advise trying to avoid coughing, whispering, or clearing the throat.  If you were given a general anesthetic or a medicine to help you relax (sedative), you will be sleepy.  There may be some pain or you may feel sick to your  stomach (nauseous), but this can usually be controlled with medicines taken by mouth.  You will stay in the recovery room until awake and able to drink fluids.  You can go back to his or her usual level of activity within several days. HOME CARE INSTRUCTIONS   Take all medicines exactly as directed.  Follow any prescribed diet.  Follow instructions regarding rest (including voice rest) and physical activity.  Follow instructions for the use of throat lozenges or gargles. SEEK IMMEDIATE MEDICAL CARE IF:   You have severe pain.  You have new bleeding during coughing, spitting, or vomiting.  You develop nausea and vomiting.  You develop new problems with swallowing.  You have difficulty breathing or have shortness of breath.  You have chest pain.  Your voice changes.  You have a fever.  You develop a cough. MAKE SURE YOU:   Understand these instructions.  Will watch your condition.  Will get help right away if you are not doing well or get worse.   This information  is not intended to replace advice given to you by your health care provider. Make sure you discuss any questions you have with your health care provider.   Document Released: 04/06/2009 Document Revised: 01/31/2014 Document Reviewed: 04/06/2009 Elsevier Interactive Patient Education 2016 Elsevier Inc[employer] Possible Gastroesophageal Reflux Disease, Adult Normally, food travels down the esophagus and stays in the stomach to be digested. However, when a person has gastroesophageal reflux disease (GERD), food and stomach acid move back up into the esophagus. When this happens, the esophagus becomes sore and inflamed. Over time, GERD can create small holes (ulcers) in the lining of the esophagus.  CAUSES This condition is caused by a problem with the muscle between the esophagus and the stomach (lower esophageal sphincter, or LES). Normally, the LES muscle closes after food passes through the esophagus to the stomach.  When the LES is weakened or abnormal, it does not close properly, and that allows food and stomach acid to go back up into the esophagus. The LES can be weakened by certain dietary substances, medicines, and medical conditions, including:  Tobacco use.  Pregnancy.  Having a hiatal hernia.  Heavy alcohol use.  Certain foods and beverages, such as coffee, chocolate, onions, and peppermint. RISK FACTORS This condition is more likely to develop in:  People who have an increased body weight.  People who have connective tissue disorders.  People who use NSAID medicines. SYMPTOMS Symptoms of this condition include:  Heartburn.  Difficult or painful swallowing.  The feeling of having a lump in the throat.  Abitter taste in the mouth.  Bad breath.  Having a large amount of saliva.  Having an upset or bloated stomach.  Belching.  Chest pain.  Shortness of breath or wheezing.  Ongoing (chronic) cough or a night-time cough.  Wearing away of tooth enamel.  Weight loss. Different conditions can cause chest pain. Make sure to see your health care provider if you experience chest pain. DIAGNOSIS Your health care provider will take a medical history and perform a physical exam. To determine if you have mild or severe GERD, your health care provider may also monitor how you respond to treatment. You may also have other tests, including:  An endoscopy toexamine your stomach and esophagus with a small camera.  A test thatmeasures the acidity level in your esophagus.  A test thatmeasures how much pressure is on your esophagus.  A barium swallow or modified barium swallow to show the shape, size, and functioning of your esophagus. TREATMENT The goal of treatment is to help relieve your symptoms and to prevent complications. Treatment for this condition may vary depending on how severe your symptoms are. Your health care provider may recommend:  Changes to your  diet.  Medicine.  Surgery. HOME CARE INSTRUCTIONS Diet  Follow a diet as recommended by your health care provider. This may involve avoiding foods and drinks such as:  Coffee and tea (with or without caffeine).  Drinks that containalcohol.  Energy drinks and sports drinks.  Carbonated drinks or sodas.  Chocolate and cocoa.  Peppermint and mint flavorings.  Garlic and onions.  Horseradish.  Spicy and acidic foods, including peppers, chili powder, curry powder, vinegar, hot sauces, and barbecue sauce.  Citrus fruit juices and citrus fruits, such as oranges, lemons, and limes.  Tomato-based foods, such as red sauce, chili, salsa, and pizza with red sauce.  Fried and fatty foods, such as donuts, french fries, potato chips, and high-fat dressings.  High-fat meats, such as hot dogs  and fatty cuts of red and white meats, such as rib eye steak, sausage, ham, and bacon.  High-fat dairy items, such as whole milk, butter, and cream cheese.  Eat small, frequent meals instead of large meals.  Avoid drinking large amounts of liquid with your meals.  Avoid eating meals during the 2-3 hours before bedtime.  Avoid lying down right after you eat.  Do not exercise right after you eat. General Instructions  Pay attention to any changes in your symptoms.  Take over-the-counter and prescription medicines only as told by your health care provider. Do not take aspirin, ibuprofen, or other NSAIDs unless your health care provider told you to do so.  Do not use any tobacco products, including cigarettes, chewing tobacco, and e-cigarettes. If you need help quitting, ask your health care provider.  Wear loose-fitting clothing. Do not wear anything tight around your waist that causes pressure on your abdomen.  Raise (elevate) the head of your bed 6 inches (15cm).  Try to reduce your stress, such as with yoga or meditation. If you need help reducing stress, ask your health care  provider.  If you are overweight, reduce your weight to an amount that is healthy for you. Ask your health care provider for guidance about a safe weight loss goal.  Keep all follow-up visits as told by your health care provider. This is important. SEEK MEDICAL CARE IF:  You have new symptoms.  You have unexplained weight loss.  You have difficulty swallowing, or it hurts to swallow.  You have wheezing or a persistent cough.  Your symptoms do not improve with treatment.  You have a hoarse voice. SEEK IMMEDIATE MEDICAL CARE IF:  You have pain in your arms, neck, jaw, teeth, or back.  You feel sweaty, dizzy, or light-headed.  You have chest pain or shortness of breath.  You vomit and your vomit looks like blood or coffee grounds.  You faint.  Your stool is bloody or black.  You cannot swallow, drink, or eat.   This information is not intended to replace advice given to you by your health care provider. Make sure you discuss any questions you have with your health care provider.   Document Released: 10/20/2004 Document Revised: 10/01/2014 Document Reviewed: 05/07/2014 Elsevier Interactive Patient Education [employer] Else[employer] Inc[employer]

## 2015-06-06 NOTE — ED Notes (Signed)
Pt reports she started taking 3 medications today for h. pylori

## 2015-06-06 NOTE — ED Notes (Signed)
Pt seen PCP earlier in week, was told to start hyploric medications. Pt reports concern about throat issue that has been going on for over 4 months. "Feels like a fist in my throat". 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 Has not seen them in over year. Pt is concerned about taking medication and is unsure about the diagnosis from the PCP.

## 2015-12-14 ENCOUNTER — Encounter (HOSPITAL_BASED_OUTPATIENT_CLINIC_OR_DEPARTMENT_OTHER): Payer: Self-pay | Admitting: *Deleted

## 2015-12-14 ENCOUNTER — Emergency Department (HOSPITAL_BASED_OUTPATIENT_CLINIC_OR_DEPARTMENT_OTHER)
Admission: EM | Admit: 2015-12-14 | Discharge: 2015-12-14 | Disposition: A | Discharge status: Home / Self Care | Payer: Medicare Other | Attending: Emergency Medicine | Admitting: Emergency Medicine

## 2015-12-14 DIAGNOSIS — Y999 Unspecified external cause status: Secondary | ICD-10-CM | POA: Insufficient documentation

## 2015-12-14 DIAGNOSIS — Y939 Activity, unspecified: Secondary | ICD-10-CM | POA: Diagnosis not present

## 2015-12-14 DIAGNOSIS — Y929 Unspecified place or not applicable: Secondary | ICD-10-CM | POA: Diagnosis not present

## 2015-12-14 DIAGNOSIS — S29012A Strain of muscle and tendon of back wall of thorax, initial encounter: Secondary | ICD-10-CM | POA: Insufficient documentation

## 2015-12-14 DIAGNOSIS — S299XXA Unspecified injury of thorax, initial encounter: Secondary | ICD-10-CM | POA: Diagnosis present

## 2015-12-14 DIAGNOSIS — S29019A Strain of muscle and tendon of unspecified wall of thorax, initial encounter: Secondary | ICD-10-CM

## 2015-12-14 DIAGNOSIS — W010XXA Fall on same level from slipping, tripping and stumbling without subsequent striking against object, initial encounter: Secondary | ICD-10-CM | POA: Diagnosis not present

## 2015-12-14 MED ORDER — NAPROXEN 500 MG PO TABS: 500.0000 mg | ORAL_TABLET | Freq: Two times a day (BID) | ORAL | 0 refills | Status: DC

## 2015-12-14 MED ORDER — HYDROCODONE-ACETAMINOPHEN 5-325 MG PO TABS: 1.0000 | ORAL_TABLET | ORAL | 0 refills | Status: DC | PRN

## 2015-12-14 MED ORDER — ORPHENADRINE CITRATE ER 100 MG PO TB12: 100.0000 mg | ORAL_TABLET | Freq: Two times a day (BID) | ORAL | 0 refills | Status: DC

## 2015-12-14 MED ORDER — TIZANIDINE HCL 4 MG PO TABS: 4.0000 mg | ORAL_TABLET | Freq: Three times a day (TID) | ORAL | 0 refills | Status: DC | PRN

## 2015-12-14 MED FILL — tiZANidine HCL 4 MG TABS: 4 | 4 days supply | Qty: 20 | Fill #0

## 2015-12-14 MED FILL — NAPROXEN 500 MG TABLET: 500 | 15 days supply | Qty: 30 | Fill #0

## 2015-12-14 NOTE — ED Triage Notes (Signed)
She slipped on a wet spot on her floor yesterday. She is having pain in her left groin.

## 2015-12-14 NOTE — ED Provider Notes (Signed)
[facility]   CSN: [id number] Arrival date & time: 12/14/15  1432 By signing my name below, I, Sherry Randall, attest that this documentation has been prepared under the direction and in the presence of Sherry BarretteMarcy Samaiya Awadallah, MD. Electronically Signed: Bridgette HabermannMaria Randall, ED Scribe. 12/14/15. 3:23 PM.  History   Chief Complaint Chief Complaint  Patient presents with  . Fall   HPI Comments: Sherry Randall is a 60 y.o. female with no pertinent PMHx, who presents to the Emergency Department complaining of intermittent, 9/10 left-sided rib cage pain s/p mechanical injury last night. Pt states she slipped on a wet spot on her floor yesterday and stretched her left leg outward to prevent her from falling. She grabbed the sink with both arms to stop herself from falling to the floor. No direct trauma to chest wall. Pain is exacerbated with movement and ambulating. She has tried Tylenol with no relief. She denies any head injury or additional injuries. Pt further denies fever, chills, cough, shortness of breath, or any other associated symptoms.   The history is provided by the patient. No language interpreter was used.    Past Medical History:  Diagnosis Date  . Back problem   . H pylori ulcer     There are no active problems to display for this patient.   Past Surgical History:  Procedure Laterality Date  . CESAREAN SECTION    . FINGER SURGERY    . HAND SURGERY  august 2016    OB History    No data available       Home Medications    Prior to Admission medications   Medication Sig Start Date End Date Taking? Authorizing Provider  azithromycin (ZITHROMAX Z-PAK) 250 MG tablet Take 1 tablet (250 mg total) by mouth daily. 500mg  PO day 1, then 250mg  PO days 205 03/27/15   Eber HongBrian Miller, MD  Fluocinolone Acetonide (DERMA-SMOOTHE/FS SCALP) 0.01 % OIL Apply to scalp at bedtime; wash scalp and hair next morning.  Use for 3 to 5 days as needed. 03/05/14   Lattie HawStephen A Beese, MD    HYDROcodone-acetaminophen (NORCO/VICODIN) 5-325 MG tablet Take 1-2 tablets by mouth every 6 (six) hours as needed for moderate pain. 03/14/15   Vanetta MuldersScott Zackowski, MD  HYDROcodone-acetaminophen (NORCO/VICODIN) 5-325 MG tablet Take 1-2 tablets by mouth every 4 (four) hours as needed for moderate pain or severe pain. 12/14/15   Sherry BarretteMarcy Maudry Zeidan, MD  ibuprofen (ADVIL,MOTRIN) 800 MG tablet Take 1 tablet (800 mg total) by mouth 3 (three) times daily. 03/27/15   Eber HongBrian Miller, MD  meclizine (ANTIVERT) 25 MG tablet Take 1 tablet (25 mg total) by mouth 3 (three) times daily as needed for dizziness. 03/14/15   Vanetta MuldersScott Zackowski, MD  naproxen (NAPROSYN) 500 MG tablet Take 1 tablet (500 mg total) by mouth 2 (two) times daily. 12/14/15   Sherry BarretteMarcy Porshia Blizzard, MD  orphenadrine (NORFLEX) 100 MG tablet Take 1 tablet (100 mg total) by mouth 2 (two) times daily. 12/14/15   Sherry BarretteMarcy Robynn Marcel, MD  pantoprazole (PROTONIX) 20 MG tablet Take 1 tablet (20 mg total) by mouth daily. 06/06/15   Sherry BarretteMarcy Tyrie Porzio, MD  ranitidine (ZANTAC) 150 MG tablet Take 1 tablet (150 mg total) by mouth 2 (two) times daily. 03/27/15   Eber HongBrian Miller, MD  Spinosad 0.9 % SUSP Apply 1 application topically once. Use as directed. 03/05/14   Lattie HawStephen A Beese, MD    Family History Family History  Problem Relation Age of Onset  . Cancer Mother  pancreatic     Social History Social History  Substance Use Topics  . Smoking status: Never Smoker  . Smokeless tobacco: Never Used  . Alcohol use No     Allergies   Augmentin [amoxicillin-pot clavulanate]; Avelox [moxifloxacin hcl in nacl]; Biaxin [clarithromycin]; and Keflet [cephalexin]   Review of Systems Review of Systems 10 Systems reviewed and all are negative for acute change except as noted in the HPI. Physical Exam Updated Vital Signs BP 146/71   Pulse 69   Temp 98 F (36.7 C) (Oral)   Resp 18   Ht 5\' 6"  (1.676 m)   Wt 110 lb (49.9 kg)   SpO2 100%   BMI 17.75 kg/m   Physical Exam   Constitutional: She appears well-developed and well-nourished.  HENT:  Head: Normocephalic.  Eyes: Conjunctivae are normal.  Cardiovascular: Normal rate, regular rhythm and normal heart sounds.  Exam reveals no gallop and no friction rub.   No murmur heard. Pulmonary/Chest: Effort normal and breath sounds normal. No respiratory distress. She has no wheezes. She has no rales. She exhibits no tenderness.  No tenderness to palpation reproducible to posterior chest wall.   Abdominal: Soft. Bowel sounds are normal. She exhibits no distension and no mass. There is no tenderness. There is no CVA tenderness.  Musculoskeletal: Normal range of motion. She exhibits tenderness.  Minimally reproducible subchondral left thoracic pain.  Neurological: She is alert.  Skin: Skin is warm and dry.  Psychiatric: She has a normal mood and affect. Her behavior is normal.  Nursing note and vitals reviewed.    ED Treatments / Results  DIAGNOSTIC STUDIES: Oxygen Saturation is 100% on RA, normal by my interpretation.    COORDINATION OF CARE: 3:21 PM Discussed treatment plan with pt at bedside which includes Rx of muscle relaxants and anti-inflammatories and pt agreed to plan.  Labs (all labs ordered are listed, but only abnormal results are displayed) Labs Reviewed - No data to display  EKG  EKG Interpretation None       Radiology No results found.  Procedures Procedures (including critical care time)  Medications Ordered in ED Medications - No data to display   Initial Impression / Assessment and Plan / ED Course  I have reviewed the triage vital signs and the nursing notes.  Pertinent labs & imaging results that were available during my care of the patient were reviewed by me and considered in my medical decision making (see chart for details).  Clinical Course    Final Clinical Impressions(s) / ED Diagnoses   Final diagnoses:  Acute thoracic myofascial strain, initial encounter   Patient clinically well. No respiratory distress. Normal heart and lung exam. Mechanism findings are consistent with low intrathoracic muscular strain.  New Prescriptions New Prescriptions   HYDROCODONE-ACETAMINOPHEN (NORCO/VICODIN) 5-325 MG TABLET    Take 1-2 tablets by mouth every 4 (four) hours as needed for moderate pain or severe pain.   NAPROXEN (NAPROSYN) 500 MG TABLET    Take 1 tablet (500 mg total) by mouth 2 (two) times daily.   ORPHENADRINE (NORFLEX) 100 MG TABLET    Take 1 tablet (100 mg total) by mouth 2 (two) times daily.     Sherry BarretteMarcy Estellar Cadena, MD 12/14/15 [phone]

## 2015-12-14 NOTE — ED Notes (Signed)
Pt request oxy-codone  Instead of hydrocodone. EDP aware. Hydrocodone cancelled d/t pt states is makes her nauseated. No further orders.

## 2015-12-29 ENCOUNTER — Emergency Department (HOSPITAL_BASED_OUTPATIENT_CLINIC_OR_DEPARTMENT_OTHER)
Admission: EM | Admit: 2015-12-29 | Discharge: 2015-12-29 | Disposition: A | Discharge status: Home / Self Care | Payer: Medicare Other | Attending: Emergency Medicine | Admitting: Emergency Medicine

## 2015-12-29 ENCOUNTER — Encounter (HOSPITAL_BASED_OUTPATIENT_CLINIC_OR_DEPARTMENT_OTHER): Payer: Self-pay | Admitting: *Deleted

## 2015-12-29 ENCOUNTER — Emergency Department (HOSPITAL_BASED_OUTPATIENT_CLINIC_OR_DEPARTMENT_OTHER): Payer: Medicare Other

## 2015-12-29 DIAGNOSIS — Z79899 Other long term (current) drug therapy: Secondary | ICD-10-CM | POA: Insufficient documentation

## 2015-12-29 DIAGNOSIS — Y929 Unspecified place or not applicable: Secondary | ICD-10-CM | POA: Diagnosis not present

## 2015-12-29 DIAGNOSIS — X509XXA Other and unspecified overexertion or strenuous movements or postures, initial encounter: Secondary | ICD-10-CM | POA: Diagnosis not present

## 2015-12-29 DIAGNOSIS — Y999 Unspecified external cause status: Secondary | ICD-10-CM | POA: Diagnosis not present

## 2015-12-29 DIAGNOSIS — S29011A Strain of muscle and tendon of front wall of thorax, initial encounter: Secondary | ICD-10-CM | POA: Diagnosis not present

## 2015-12-29 DIAGNOSIS — Y939 Activity, unspecified: Secondary | ICD-10-CM | POA: Diagnosis not present

## 2015-12-29 DIAGNOSIS — S299XXA Unspecified injury of thorax, initial encounter: Secondary | ICD-10-CM | POA: Diagnosis present

## 2015-12-29 MED ORDER — ACETAMINOPHEN 160 MG/5ML PO ELIX: 500.0000 mg | ORAL_SOLUTION | ORAL | 0 refills | Status: DC | PRN

## 2015-12-29 MED FILL — MAPAP 160 MG/5 ML ELIXIR: 160 | 6 days supply | Qty: 472 | Fill #0

## 2015-12-29 NOTE — ED Provider Notes (Signed)
[facility]   CSN: [id number] Arrival date & time: 12/29/15  1046     History   Chief Complaint Chief Complaint  Patient presents with  . Rib Injury    HPI Sherry Randall is a 60 y.o. female.  HPI Patient reports that she was stretching 3 days ago and felt a big pop in the right side of her lower rib cage. She reports ever since is been painful in the area and is worse when she takes a deep breath. It is not getting better and she is worried that something is broken or out of place. No fever chills or cough. Shortness of breath. Pain is reproduced by twisting and deep breath. Past Medical History:  Diagnosis Date  . Back problem   . H pylori ulcer     There are no active problems to display for this patient.   Past Surgical History:  Procedure Laterality Date  . CESAREAN SECTION    . FINGER SURGERY    . HAND SURGERY  august 2016    OB History    No data available       Home Medications    Prior to Admission medications   Medication Sig Start Date End Date Taking? Authorizing Provider  acetaminophen (TYLENOL) 160 MG/5ML elixir Take 15.6 mLs (500 mg total) by mouth every 4 (four) hours as needed for fever. 12/29/15   Arby BarretteMarcy Rennee Coyne, MD  azithromycin (ZITHROMAX Z-PAK) 250 MG tablet Take 1 tablet (250 mg total) by mouth daily. 500mg  PO day 1, then 250mg  PO days 205 03/27/15   Eber HongBrian Miller, MD  Fluocinolone Acetonide (DERMA-SMOOTHE/FS SCALP) 0.01 % OIL Apply to scalp at bedtime; wash scalp and hair next morning.  Use for 3 to 5 days as needed. 03/05/14   Lattie HawStephen A Beese, MD  HYDROcodone-acetaminophen (NORCO/VICODIN) 5-325 MG tablet Take 1-2 tablets by mouth every 6 (six) hours as needed for moderate pain. 03/14/15   Vanetta MuldersScott Zackowski, MD  HYDROcodone-acetaminophen (NORCO/VICODIN) 5-325 MG tablet Take 1-2 tablets by mouth every 4 (four) hours as needed for moderate pain or severe pain. 12/14/15   Arby BarretteMarcy Ronrico Dupin, MD  ibuprofen (ADVIL,MOTRIN) 800 MG  tablet Take 1 tablet (800 mg total) by mouth 3 (three) times daily. 03/27/15   Eber HongBrian Miller, MD  meclizine (ANTIVERT) 25 MG tablet Take 1 tablet (25 mg total) by mouth 3 (three) times daily as needed for dizziness. 03/14/15   Vanetta MuldersScott Zackowski, MD  naproxen (NAPROSYN) 500 MG tablet Take 1 tablet (500 mg total) by mouth 2 (two) times daily. 12/14/15   Arby BarretteMarcy Yesenia Locurto, MD  pantoprazole (PROTONIX) 20 MG tablet Take 1 tablet (20 mg total) by mouth daily. 06/06/15   Arby BarretteMarcy Kalasia Crafton, MD  ranitidine (ZANTAC) 150 MG tablet Take 1 tablet (150 mg total) by mouth 2 (two) times daily. 03/27/15   Eber HongBrian Miller, MD  Spinosad 0.9 % SUSP Apply 1 application topically once. Use as directed. 03/05/14   Lattie HawStephen A Beese, MD  tiZANidine (ZANAFLEX) 4 MG tablet Take 1 tablet (4 mg total) by mouth every 8 (eight) hours as needed for muscle spasms. 12/14/15   Arby BarretteMarcy Quention Mcneill, MD    Family History Family History  Problem Relation Age of Onset  . Cancer Mother     pancreatic     Social History Social History  Substance Use Topics  . Smoking status: Never Smoker  . Smokeless tobacco: Never Used  . Alcohol use No     Allergies   Augmentin [amoxicillin-pot clavulanate]; Avelox [  moxifloxacin hcl in nacl]; Biaxin [clarithromycin]; Hydrocodone; and Keflet [cephalexin]   Review of Systems Review of Systems Constitutional: No fevers no chills GI: No nausea vomiting or abdominal pain Muscular skeletal: No lower extremity swelling or calf pain.  Physical Exam Updated Vital Signs BP 122/80   Pulse 65   Temp 98 F (36.7 C) (Oral)   Resp 18   Ht 5\' 6"  (1.676 m)   Wt 110 lb (49.9 kg)   SpO2 100%   BMI 17.75 kg/m   Physical Exam  Constitutional: She is oriented to person, place, and time.  Patient is ambulatory about the emergency department and clinically well in appearance she is alert with no respiratory distress. She ambulates without antalgic gait or dyspnea.  HENT:  Head: Normocephalic and atraumatic.  Eyes: EOM  are normal.  Cardiovascular: Normal rate, regular rhythm, normal heart sounds and intact distal pulses.   Pulmonary/Chest: Effort normal and breath sounds normal. No respiratory distress. She has no wheezes. She has no rales. She exhibits tenderness.  Reproducible chest wall on the midline of the anterior right chest from approximately ribs #6-8 no palpable anomaly or crepitus.  Abdominal: Soft. She exhibits no distension. There is no tenderness. There is no guarding.  Musculoskeletal: Normal range of motion. She exhibits no deformity.  Neurological: She is alert and oriented to person, place, and time. She exhibits normal muscle tone. Coordination normal.  Skin: Skin is warm and dry.  Psychiatric: She has a normal mood and affect.     ED Treatments / Results  Labs (all labs ordered are listed, but only abnormal results are displayed) Labs Reviewed - No data to display  EKG  EKG Interpretation None       Radiology Dg Ribs Unilateral W/chest Right  Result Date: 12/29/2015 CLINICAL DATA:  Right rib pain EXAM: RIGHT RIBS AND CHEST - 3+ VIEW COMPARISON:  03/26/2015 FINDINGS: Normal heart size. No pleural effusion or edema. No airspace opacities. A fiducial marker was placed over the area of concern along the lateral border of the right lower ribs. No underlying displaced rib fractures identified. IMPRESSION: 1. No acute cardiopulmonary abnormalities. 2. No displaced rib fractures identified. Electronically Signed   By: Signa Kellaylor  Stroud M.D.   On: 12/29/2015 11:47    Procedures Procedures (including critical care time)  Medications Ordered in ED Medications - No data to display   Initial Impression / Assessment and Plan / ED Course  I have reviewed the triage vital signs and the nursing notes.  Pertinent labs & imaging results that were available during my care of the patient were reviewed by me and considered in my medical decision making (see chart for details).  Clinical Course      Final Clinical Impressions(s) / ED Diagnoses   Final diagnoses:  Muscle strain of chest wall, initial encounter   Chest x-ray shows no pneumothorax or rib fracture. History is consistent with chest wall strain. Patient had been seen by myself on a prior visit with chest for strain which she described as having been from slipping and having to grab onto the sink. She reports that pain did resolve. She reports the prescribed Flexeril made her feel funny and she didn't take much of it. She states that liquid Tylenol is most effective for her. Patient is prescribed Tylenol elixir form to take for pain. She still has Flexeril from her prior prescription which she will take if she feels that she needs it. Patient is counseled on signs and symptoms  for which to return. New Prescriptions New Prescriptions   ACETAMINOPHEN (TYLENOL) 160 MG/5ML ELIXIR    Take 15.6 mLs (500 mg total) by mouth every 4 (four) hours as needed for fever.     Arby Barrette, MD 12/29/15 [phone]

## 2015-12-29 NOTE — ED Triage Notes (Signed)
She was stretching and heard a pop in her right ribs 3 days ago.

## 2016-05-10 ENCOUNTER — Emergency Department (HOSPITAL_BASED_OUTPATIENT_CLINIC_OR_DEPARTMENT_OTHER)
Admission: EM | Admit: 2016-05-10 | Discharge: 2016-05-10 | Disposition: A | Discharge status: Home / Self Care | Payer: Medicare Other | Attending: Emergency Medicine | Admitting: Emergency Medicine

## 2016-05-10 ENCOUNTER — Encounter (HOSPITAL_BASED_OUTPATIENT_CLINIC_OR_DEPARTMENT_OTHER): Payer: Self-pay

## 2016-05-10 DIAGNOSIS — S76312A Strain of muscle, fascia and tendon of the posterior muscle group at thigh level, left thigh, initial encounter: Secondary | ICD-10-CM | POA: Insufficient documentation

## 2016-05-10 DIAGNOSIS — Y9339 Activity, other involving climbing, rappelling and jumping off: Secondary | ICD-10-CM | POA: Diagnosis not present

## 2016-05-10 DIAGNOSIS — Y929 Unspecified place or not applicable: Secondary | ICD-10-CM | POA: Diagnosis not present

## 2016-05-10 DIAGNOSIS — Y998 Other external cause status: Secondary | ICD-10-CM | POA: Insufficient documentation

## 2016-05-10 DIAGNOSIS — M79605 Pain in left leg: Secondary | ICD-10-CM

## 2016-05-10 DIAGNOSIS — X58XXXA Exposure to other specified factors, initial encounter: Secondary | ICD-10-CM | POA: Diagnosis not present

## 2016-05-10 DIAGNOSIS — S79922A Unspecified injury of left thigh, initial encounter: Secondary | ICD-10-CM | POA: Diagnosis present

## 2016-05-10 MED ORDER — CYCLOBENZAPRINE HCL 10 MG PO TABS: 10.0000 mg | ORAL_TABLET | Freq: Two times a day (BID) | ORAL | 0 refills | Status: DC | PRN

## 2016-05-10 MED ORDER — IBUPROFEN 400 MG PO TABS: 400.0000 mg | ORAL_TABLET | Freq: Four times a day (QID) | ORAL | 0 refills | Status: DC | PRN

## 2016-05-10 MED ORDER — NAPROXEN 250 MG PO TABS
500.0000 mg | ORAL_TABLET | Freq: Once | ORAL | Status: AC
  Administered 2016-05-10: 500 mg via ORAL
  Filled 2016-05-10: qty 2

## 2016-05-10 MED ORDER — METHOCARBAMOL 500 MG PO TABS
500.0000 mg | ORAL_TABLET | Freq: Once | ORAL | Status: AC
  Administered 2016-05-10: 500 mg via ORAL
  Filled 2016-05-10: qty 1

## 2016-05-10 MED FILL — IBUPROFEN 400 MG TABLET: 400 | 8 days supply | Qty: 30 | Fill #0

## 2016-05-10 MED FILL — CYCLOBENZAPRINE 10 MG TAB: 10 | 10 days supply | Qty: 20 | Fill #0

## 2016-05-10 NOTE — Discharge Instructions (Signed)
Please read and follow all provided instructions.  Your diagnoses today include:  1. Left leg pain   2. Strain of left hamstring     Tests performed today include: Vital signs. See below for your results today.   Medications prescribed:  Take as prescribed   Home care instructions:  Follow any educational materials contained in this packet.  Follow-up instructions: Please follow-up with your primary care provider for further evaluation of symptoms and treatment   Return instructions:  Please return to the Emergency Department if you do not get better, if you get worse, or new symptoms OR  - Fever (temperature greater than 101.70F)  - Bleeding that does not stop with holding pressure to the area    -Severe pain (please note that you may be more sore the day after your accident)  - Chest Pain  - Difficulty breathing  - Severe nausea or vomiting  - Inability to tolerate food and liquids  - Passing out  - Skin becoming red around your wounds  - Change in mental status (confusion or lethargy)  - New numbness or weakness    Please return if you have any other emergent concerns.  Additional Information:  Your vital signs today were: BP 121/74 (BP Location: Left Arm)    Pulse 66    Temp 98.2 F (36.8 C) (Oral)    Resp 18    Ht  (1.676 m)    Wt 49.9 kg    SpO2 100%    BMI 17.75 kg/m  If your blood pressure (BP) was elevated above 135/85 this visit, please have this repeated by your doctor within one month. ---------------

## 2016-05-10 NOTE — ED Triage Notes (Signed)
c/o pain to posterior  left upper leg x 3 weeks after climbing up sand-NAD-steady gait

## 2016-05-10 NOTE — ED Provider Notes (Signed)
[facility]   CSN: [id number] Arrival date & time: 05/10/16  1118     History   Chief Complaint Chief Complaint  Patient presents with  . Leg Pain    HPI Sherry Randall is a 61 y.o. female.  HPI  61 y.o. female presents to the Emergency Department today complaining of posterior left upper leg pain x 3 weeks s/p climbing sand dunes. Noted climbing dunes and feeling pain the morning afterwards. Pt states that is below left buttocks. Notes improvement of pain gradually, but still lingering. Area TTP. Worse in AM, but gradually somewhat improves throughout the day. No rashes. No numbness/tingling. No trauma to area. No CP/SOB. No hx DVT/PE. No recent surgeries. No recent long distance travel. No other symptoms noted.     Past Medical History:  Diagnosis Date  . Back problem   . H pylori ulcer     There are no active problems to display for this patient.   Past Surgical History:  Procedure Laterality Date  . CESAREAN SECTION    . FINGER SURGERY    . HAND SURGERY  august 2016    OB History    No data available       Home Medications    Prior to Admission medications   Medication Sig Start Date End Date Taking? Authorizing Provider  acetaminophen (TYLENOL) 160 MG/5ML elixir Take 15.6 mLs (500 mg total) by mouth every 4 (four) hours as needed for fever. 12/29/15   Arby Barrette, MD  ibuprofen (ADVIL,MOTRIN) 800 MG tablet Take 1 tablet (800 mg total) by mouth 3 (three) times daily. 03/27/15   Eber Hong, MD    Family History Family History  Problem Relation Age of Onset  . Cancer Mother     pancreatic     Social History Social History  Substance Use Topics  . Smoking status: Never Smoker  . Smokeless tobacco: Never Used  . Alcohol use No     Allergies   Augmentin [amoxicillin-pot clavulanate]; Avelox [moxifloxacin hcl in nacl]; Biaxin [clarithromycin]; Hydrocodone; and Keflet [cephalexin]   Review of Systems Review of Systems    Constitutional: Negative for fever.  Musculoskeletal: Positive for myalgias. Negative for arthralgias.  Neurological: Negative for numbness.   Physical Exam Updated Vital Signs BP 121/74 (BP Location: Left Arm)   Pulse 66   Temp 98.2 F (36.8 C) (Oral)   Resp 18   Ht  (1.676 m)   Wt 49.9 kg   SpO2 100%   BMI 17.75 kg/m   Physical Exam  Constitutional: She is oriented to person, place, and time. Vital signs are normal. She appears well-developed and well-nourished.  HENT:  Head: Normocephalic and atraumatic.  Right Ear: Hearing normal.  Left Ear: Hearing normal.  Eyes: Conjunctivae and EOM are normal. Pupils are equal, round, and reactive to light.  Neck: Normal range of motion. Neck supple.  Cardiovascular: Normal rate, regular rhythm, normal heart sounds and intact distal pulses.   Pulmonary/Chest: Effort normal and breath sounds normal.  Abdominal: Soft.  Musculoskeletal: Normal range of motion.  Left posterior leg below buttocks TTP. No obvious swelling or deformities. No pain with internal/external rotation of hip join. ROM intact. Ambulates well without gait abnormality. Distal pulses appreciated. NVI.   Neurological: She is alert and oriented to person, place, and time.  Skin: Skin is warm and dry.  Psychiatric: She has a normal mood and affect. Her speech is normal and behavior is normal. Thought content normal.  Nursing note and vitals reviewed.  ED Treatments / Results  Labs (all labs ordered are listed, but only abnormal results are displayed) Labs Reviewed - No data to display  EKG  EKG Interpretation None       Radiology No results found.  Procedures Procedures (including critical care time)  Medications Ordered in ED Medications - No data to display   Initial Impression / Assessment and Plan / ED Course  I have reviewed the triage vital signs and the nursing notes.  Pertinent labs & imaging results that were available during my care of  the patient were reviewed by me and considered in my medical decision making (see chart for details).  Final Clinical Impressions(s) / ED Diagnoses     {I have reviewed the relevant previous healthcare records.  {I obtained HPI from historian.   ED Course:  Assessment: Pt is a 61 y.o. female who presents with left posterior thigh pain x 3 weeks. Occurred s/p climbing sand dunes with pain occurring morning after. No numbness/tingling. NO trauma to area. No hx DVT/PE. No recent surgeries or travel. No CP/SOB. On exam, pt in NAD. Nontoxic/nonseptic appearing. VSS. Afebrile. Lungs CTA. Heart RRR. Abdomen nontender soft. Area TTP below hamstring insertion. NVI. Distal pulses appreciated. No obvious swelling or palpable deformities. ROM intact without pain. Doubt DVT. Likely hamstring strain. Muscle relaxants and NSAIDs with stretching and heat. Close follow up to PCP. Strict return precautions given. Plan is to DC Home. At time of discharge, Patient is in no acute distress. Vital Signs are stable. Patient is able to ambulate. Patient able to tolerate PO.    Disposition/Plan: DC Home Additional Verbal discharge instructions given and discussed with patient.  Pt Instructed to f/u with PCP in the next week for evaluation and treatment of symptoms. Return precautions given Pt acknowledges and agrees with plan  Supervising Physician Zadie Rhine, MD  Final diagnoses:  Left leg pain  Strain of left hamstring    New Prescriptions New Prescriptions   No medications on file     Audry Pili, PA-C 05/10/16 1149    Zadie Rhine, MD 05/10/16 1229

## 2016-05-10 NOTE — ED Notes (Signed)
Pt directed to pharmacy to pick up Rx 

## 2016-05-13 ENCOUNTER — Telehealth (HOSPITAL_BASED_OUTPATIENT_CLINIC_OR_DEPARTMENT_OTHER): Payer: Self-pay | Admitting: *Deleted

## 2016-05-13 NOTE — Telephone Encounter (Signed)
Pt called requesting Ibuprofen Rx'ed at ED visit 2 days ago be changed to naproxen which was administered during ED visit. Discussed with Dr. Joni Fears and rx for Naproxen  PO every 12 hours PRN pain, dispense 30, no refills called in to [facility] pharmacy per pt request.

## 2016-05-25 MED FILL — NAPROXEN 500 MG TABLET: 500 | 15 days supply | Qty: 30 | Fill #0

## 2016-06-28 ENCOUNTER — Emergency Department (HOSPITAL_COMMUNITY)
Admission: EM | Admit: 2016-06-28 | Discharge: 2016-06-28 | Disposition: A | Discharge status: Home / Self Care | Payer: Medicare Other | Attending: Emergency Medicine | Admitting: Emergency Medicine

## 2016-06-28 ENCOUNTER — Encounter (HOSPITAL_COMMUNITY): Payer: Self-pay | Admitting: Emergency Medicine

## 2016-06-28 DIAGNOSIS — Z79899 Other long term (current) drug therapy: Secondary | ICD-10-CM | POA: Diagnosis not present

## 2016-06-28 DIAGNOSIS — M545 Low back pain, unspecified: Secondary | ICD-10-CM

## 2016-06-28 DIAGNOSIS — M549 Dorsalgia, unspecified: Secondary | ICD-10-CM | POA: Diagnosis present

## 2016-06-28 LAB — BASIC METABOLIC PANEL
Anion gap: 12 (ref 5–15)
BUN: 10 mg/dL (ref 6–20)
CHLORIDE: 100 mmol/L — AB (ref 101–111)
CO2: 26 mmol/L (ref 22–32)
CREATININE: 0.73 mg/dL (ref 0.44–1.00)
Calcium: 9.3 mg/dL (ref 8.9–10.3)
Glucose, Bld: 94 mg/dL (ref 65–99)
POTASSIUM: 3.7 mmol/L (ref 3.5–5.1)
SODIUM: 138 mmol/L (ref 135–145)

## 2016-06-28 LAB — CBC
HEMATOCRIT: 39.1 % (ref 36.0–46.0)
Hemoglobin: 12.9 g/dL (ref 12.0–15.0)
MCH: 28.4 pg (ref 26.0–34.0)
MCHC: 33 g/dL (ref 30.0–36.0)
MCV: 86.1 fL (ref 78.0–100.0)
PLATELETS: 276 10*3/uL (ref 150–400)
RBC: 4.54 MIL/uL (ref 3.87–5.11)
RDW: 14.2 % (ref 11.5–15.5)
WBC: 5.2 10*3/uL (ref 4.0–10.5)

## 2016-06-28 LAB — URINALYSIS, ROUTINE W REFLEX MICROSCOPIC
Bilirubin Urine: NEGATIVE
Glucose, UA: NEGATIVE mg/dL
HGB URINE DIPSTICK: NEGATIVE
Ketones, ur: NEGATIVE mg/dL
Leukocytes, UA: NEGATIVE
NITRITE: NEGATIVE
PROTEIN: NEGATIVE mg/dL
SPECIFIC GRAVITY, URINE: 1.003 — AB (ref 1.005–1.030)
pH: 7 (ref 5.0–8.0)

## 2016-06-28 MED ORDER — KETOROLAC TROMETHAMINE 60 MG/2ML IM SOLN
15.0000 mg | Freq: Once | INTRAMUSCULAR | Status: AC
  Administered 2016-06-28: 15 mg via INTRAMUSCULAR
  Filled 2016-06-28: qty 2

## 2016-06-28 MED ORDER — MORPHINE SULFATE (PF) 2 MG/ML IV SOLN
4.0000 mg | Freq: Once | INTRAVENOUS | Status: AC
  Administered 2016-06-28: 4 mg via INTRAMUSCULAR
  Filled 2016-06-28: qty 2

## 2016-06-28 MED ORDER — METHOCARBAMOL 500 MG PO TABS: 500.0000 mg | ORAL_TABLET | Freq: Two times a day (BID) | ORAL | 0 refills | Status: DC

## 2016-06-28 MED ORDER — DIAZEPAM 2 MG PO TABS
2.0000 mg | ORAL_TABLET | Freq: Once | ORAL | Status: AC
  Administered 2016-06-28: 2 mg via ORAL
  Filled 2016-06-28: qty 1

## 2016-06-28 NOTE — ED Provider Notes (Signed)
[facility]-EMERGENCY DEPT Provider Note   CSN: [id number] Arrival date & time: 06/28/16  1601     History   Chief Complaint Chief Complaint  Patient presents with  . Hip Pain  . Urinary Frequency    HPI Vaunda Gutterman is a 61 y.o. female.  61 year old female with prior history of L5 disc disease presents with right-sided back pain in the same location as her prior injury. Denies any bowel or bladder dysfunction. Pain does remain on her thigh. Patient has some dark urine but recent saw her urologist yesterday was worked up for that which was negative. Denies any rashes. Pain is sharp and worse with standing and better with rest. Has used over-the-counter medications without relief. Denies any recent history of trauma.      Past Medical History:  Diagnosis Date  . Back problem   . H pylori ulcer     There are no active problems to display for this patient.   Past Surgical History:  Procedure Laterality Date  . CESAREAN SECTION    . FINGER SURGERY    . HAND SURGERY  august 2016    OB History    No data available       Home Medications    Prior to Admission medications   Medication Sig Start Date End Date Taking? Authorizing Provider  acetaminophen (TYLENOL) 160 MG/5ML elixir Take 15.6 mLs (500 mg total) by mouth every 4 (four) hours as needed for fever. 12/29/15   Arby Barrette, MD  cyclobenzaprine (FLEXERIL) 10 MG tablet Take 1 tablet (10 mg total) by mouth 2 (two) times daily as needed for muscle spasms. 05/10/16   Audry Pili, PA-C  ibuprofen (ADVIL,MOTRIN) 400 MG tablet Take 1 tablet (400 mg total) by mouth every 6 (six) hours as needed. 05/10/16   Audry Pili, PA-C    Family History Family History  Problem Relation Age of Onset  . Cancer Mother        pancreatic     Social History Social History  Substance Use Topics  . Smoking status: Never Smoker  . Smokeless tobacco: Never Used  . Alcohol use No     Allergies   Augmentin [amoxicillin-pot  clavulanate]; Avelox [moxifloxacin hcl in nacl]; Biaxin [clarithromycin]; Hydrocodone; and Keflet [cephalexin]   Review of Systems Review of Systems  All other systems reviewed and are negative.    Physical Exam Updated Vital Signs BP 138/76 (BP Location: Right Arm)   Pulse 67   Temp 98.4 F (36.9 C) (Oral)   Resp 18   Ht 1.676 m (5\' 6" )   Wt 49.9 kg (110 lb)   SpO2 100%   BMI 17.75 kg/m   Physical Exam  Constitutional: She is oriented to person, place, and time. She appears well-developed and well-nourished.  Non-toxic appearance. No distress.  HENT:  Head: Normocephalic and atraumatic.  Eyes: Conjunctivae, EOM and lids are normal. Pupils are equal, round, and reactive to light.  Neck: Normal range of motion. Neck supple. No tracheal deviation present. No thyroid mass present.  Cardiovascular: Normal rate, regular rhythm and normal heart sounds.  Exam reveals no gallop.   No murmur heard. Pulmonary/Chest: Effort normal and breath sounds normal. No stridor. No respiratory distress. She has no decreased breath sounds. She has no wheezes. She has no rhonchi. She has no rales.  Abdominal: Soft. Normal appearance and bowel sounds are normal. She exhibits no distension. There is no tenderness. There is no rebound and no CVA tenderness.  Musculoskeletal: Normal range  of motion. She exhibits no edema or tenderness.       Back:  Neurological: She is alert and oriented to person, place, and time. She has normal strength. No cranial nerve deficit or sensory deficit. GCS eye subscore is 4. GCS verbal subscore is 5. GCS motor subscore is 6.  Skin: Skin is warm and dry. No abrasion and no rash noted.  Psychiatric: She has a normal mood and affect. Her speech is normal and behavior is normal.  Nursing note and vitals reviewed.    ED Treatments / Results  Labs (all labs ordered are listed, but only abnormal results are displayed) Labs Reviewed  URINALYSIS, ROUTINE W REFLEX MICROSCOPIC  - Abnormal; Notable for the following:       Result Value   Color, Urine STRAW (*)    Specific Gravity, Urine 1.003 (*)    All other components within normal limits  BASIC METABOLIC PANEL - Abnormal; Notable for the following:    Chloride 100 (*)    All other components within normal limits  CBC    EKG  EKG Interpretation None       Radiology No results found.  Procedures Procedures (including critical care time)  Medications Ordered in ED Medications  morphine 2 MG/ML injection 4 mg (not administered)  diazepam (VALIUM) tablet 2 mg (not administered)  ketorolac (TORADOL) injection 15 mg (not administered)     Initial Impression / Assessment and Plan / ED Course  I have reviewed the triage vital signs and the nursing notes.  Pertinent labs & imaging results that were available during my care of the patient were reviewed by me and considered in my medical decision making (see chart for details).     Patient feels better after being medicated. Suspect muscle skeletal etiology.  Final Clinical Impressions(s) / ED Diagnoses   Final diagnoses:  None    New Prescriptions New Prescriptions   No medications on file     Lorre NickAllen, Jennica Tagliaferri, MD 06/28/16 2249

## 2016-06-28 NOTE — ED Triage Notes (Signed)
Patient states she has been having brown and cloudy urine. Patient also complain of weakness. Three hours ago patient started having pain in right hip. Patient states she has never had this much pain in her life.

## 2016-12-27 ENCOUNTER — Encounter (INDEPENDENT_AMBULATORY_CARE_PROVIDER_SITE_OTHER): Payer: Self-pay | Admitting: Orthopaedic Surgery

## 2016-12-27 ENCOUNTER — Ambulatory Visit (INDEPENDENT_AMBULATORY_CARE_PROVIDER_SITE_OTHER): Payer: Medicare Other | Admitting: Orthopaedic Surgery

## 2016-12-27 VITALS — Ht 66.0 in | Wt 110.0 lb

## 2016-12-27 DIAGNOSIS — M542 Cervicalgia: Secondary | ICD-10-CM | POA: Diagnosis not present
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
## 2017-01-05 ENCOUNTER — Telehealth: Payer: Self-pay
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
## 2017-01-05 NOTE — Telephone Encounter (Signed)
Is this okay? If so, what would you like order to say?  

## 2017-01-05 NOTE — Telephone Encounter (Signed)
Patient would like a referral for therapy.  Cb#  [phone].  Please advise.  Thank you.

## 2017-01-06 ENCOUNTER — Other Ambulatory Visit (INDEPENDENT_AMBULATORY_CARE_PROVIDER_SITE_OTHER): Payer: Self-pay

## 2017-01-06 DIAGNOSIS — M542 Cervicalgia: Secondary | ICD-10-CM

## 2017-01-06 NOTE — Telephone Encounter (Signed)
Order for PT made. Someone will call her to schedule appt.

## 2017-01-10 ENCOUNTER — Ambulatory Visit: Payer: Medicare Other | Attending: Orthopaedic Surgery | Admitting: Physical Therapy

## 2017-01-10 ENCOUNTER — Encounter: Payer: Self-pay | Admitting: Physical Therapy

## 2017-01-10 ENCOUNTER — Other Ambulatory Visit: Payer: Self-pay

## 2017-01-10 DIAGNOSIS — M542 Cervicalgia: Secondary | ICD-10-CM | POA: Diagnosis not present

## 2017-01-10 DIAGNOSIS — R293 Abnormal posture: Secondary | ICD-10-CM | POA: Diagnosis present

## 2017-01-10 DIAGNOSIS — R29898 Other symptoms and signs involving the musculoskeletal system: Secondary | ICD-10-CM | POA: Insufficient documentation
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
## 2017-01-10 NOTE — Patient Instructions (Signed)
AROM: Neck Rotation   Turn head slowly to look over one shoulder, then the other. Hold each position __5__ seconds. Repeat _10-15___ times per set.   Flexibility: Upper Trapezius Stretch   Gently grasp right side of head while reaching behind back with other hand. Tilt head away until a gentle stretch is felt. Hold __30__ seconds. Repeat __3__ times per set.   Levator Scapula Stretch, Sitting   Sit, one hand tucked under hip on side to be stretched, other hand over top of head. Turn head toward other side and look down. Use hand on head to gently stretch neck in that position. Hold _30__ seconds. Repeat _3__ times per session.   Scapular Retraction (Standing)   With arms at sides, pinch shoulder blades together. Repeat __15__ times per set.  Do _2-3___ sessions per day.  Axial Extension (Chin Tuck)   Pull chin in and lengthen back of neck. Hold __5__ seconds while counting out loud. Repeat __15__ times.

## 2017-01-19 ENCOUNTER — Ambulatory Visit: Payer: Medicare Other

## 2017-01-19 DIAGNOSIS — M542 Cervicalgia: Secondary | ICD-10-CM

## 2017-01-19 DIAGNOSIS — R29898 Other symptoms and signs involving the musculoskeletal system: Secondary | ICD-10-CM

## 2017-01-19 DIAGNOSIS — R293 Abnormal posture: Secondary | ICD-10-CM

## 2017-01-19 NOTE — Therapy (Signed)
[facility] [place] [address]  [address] [place], [place], [address] Phone: [phone]   Fax:  [phone]  Physical Therapy Treatment  Patient Details  Name: Sherry Randall MRN: [id number] Date of Birth: 10-14-1955 Referring Provider: Dr. Roda ShuttersXu   Encounter Date: 01/19/2017  PT End of Session - 01/19/17 1451    Visit Number  2    Number of Visits  12    Date for PT Re-Evaluation  02/21/17    Authorization Type  Medicare    PT Start Time  1445    PT Stop Time  1528    PT Time Calculation (min)  43 min    Activity Tolerance  Patient tolerated treatment well    Behavior During Therapy  [facility]WFL for tasks assessed/performed       Past Medical History:  Diagnosis Date  . Back problem   . H pylori ulcer     Past Surgical History:  Procedure Laterality Date  . CESAREAN SECTION    . FINGER SURGERY    . HAND SURGERY  august 2016    There were no vitals filed for this visit.  Subjective Assessment - 01/19/17 1451    Subjective  Pt. reporting no issues with HEP and reports performing 2x/day.  Pt. reporting R anterior shoulder has been "bothering" her last few days most with overhead activities and "reaching back".  Reports walking 1 mile daily.      Diagnostic tests  none recently    Patient Stated Goals  improve pain, return to exercise    Currently in Pain?  Yes    Pain Score  9     Pain Location  Head "pressure" at top of head     Pain Orientation  Anterior    Pain Descriptors / Indicators  -- "pressure"    Pain Type  Acute pain    Aggravating Factors   carrying items     Pain Relieving Factors  moist heat     Multiple Pain Sites  No                      [facility] Adult PT Treatment/Exercise - 01/19/17 1458      Neck Exercises: Machines for Strengthening   UBE (Upper Arm Bike)  Lvl 1.0, 3 min forward/backwards      Neck Exercises: Theraband   Rows  10 reps;Red    Rows Limitations  with tactile cueing  required for scapular squeeze     Shoulder External Rotation  10 reps yellow TB     Shoulder External Rotation Limitations  Hooklying on pool noodle     Horizontal ABduction  10 reps yellow TB     Horizontal ABduction Limitations  Hooklying on pool noodle     Other Theraband Exercises  Alternating flexion/extension with yellow TB in hooklying on pool noodle x 10 reps      Neck Exercises: Seated   Neck Retraction  10 reps;5 secs    Other Seated Exercise  scap retraction 10 x 5 seconds at doorframe       Manual Therapy   Manual Therapy  Soft tissue mobilization    Manual therapy comments  seated    Soft tissue mobilization  STM to B UT, LS, cervical paraspinals, B medial scapular border; no signficant ttp       Neck Exercises: Stretches   Upper Trapezius Stretch  2 reps;30 seconds    Upper Trapezius Stretch Limitations  bilateral    Levator Stretch  2 reps;30 seconds    Levator Stretch Limitations  bilateral                  PT Long Term Goals - 01/19/17 1459      PT LONG TERM GOAL #1   Title  patient to be independent with advanced HEP    Status  On-going      PT LONG TERM GOAL #2   Title  patient to demonstrate full cervical and B shoulder AROM with no pain production    Status  On-going      PT LONG TERM GOAL #3   Title  patient to demosntrate good postural awareness and body mechanics as it relates to her daily activities.     Status  On-going      PT LONG TERM GOAL #4   Title  patient to report ability to perform ADLs, exercise, and sleeping with pain no greater than 2/10 for greater than 2 weeks    Status  On-going            Plan - 01/19/17 1522    Clinical Impression Statement  Pt. reporting, "my head pain has just turned into pressure now, so I guess that's good".  Reports she is performing HEP 2x/day without issue.  With complaint of R anterior shoulder pain with overhead activities over this past week, noting "it just feels broke".  Good tolerance  for gentle scapular strengthening today.  Pt. reporting "tension" in B upper shoulders and neck and reporting significant pain in certain resting positions in session today however pt. posture and behavior not consistent with this report.  Will progress toward goals per pt. tolerance in coming visits.      PT Treatment/Interventions  ADLs/Self Care Home Management;Cryotherapy;Electrical Stimulation;Iontophoresis 4mg /ml Dexamethasone;Moist Heat;Traction;Therapeutic exercise;Therapeutic activities;Ultrasound;Neuromuscular re-education;Patient/family education;Manual techniques;Vasopneumatic Device;Taping;Dry needling;Passive range of motion    Consulted and Agree with Plan of Care  Patient       Patient will benefit from skilled therapeutic intervention in order to improve the following deficits and impairments:  Pain, Impaired UE functional use, Decreased strength, Decreased activity tolerance  Visit Diagnosis: Cervicalgia  Abnormal posture  Other symptoms and signs involving the musculoskeletal system     Problem List There are no active problems to display for this patient.   Kermit BaloMicah Renzo Vincelette, PTA 01/19/17 6:32 PM  [facility] [place] [address]  [address] [place], [place], [address] Phone: [phone]   Fax:  [phone]  Name: Sherry Randall MRN: [id number] Date of Birth: Jan 29, 1955

## 2017-01-25 ENCOUNTER — Ambulatory Visit: Payer: Medicare Other | Admitting: Physical Therapy

## 2017-01-26 ENCOUNTER — Ambulatory Visit: Payer: Medicare Other | Attending: Orthopaedic Surgery

## 2017-01-26 DIAGNOSIS — M542 Cervicalgia: Secondary | ICD-10-CM | POA: Diagnosis not present

## 2017-01-26 DIAGNOSIS — R293 Abnormal posture: Secondary | ICD-10-CM | POA: Diagnosis present

## 2017-01-26 DIAGNOSIS — R29898 Other symptoms and signs involving the musculoskeletal system: Secondary | ICD-10-CM | POA: Insufficient documentation

## 2017-01-26 NOTE — Therapy (Signed)
[facility] [address]  [address] [place], [place], [address] Phone: [phone]   Fax:  [phone]  Physical Therapy Treatment  Patient Details  Name: Sherry Randall MRN: [id number] Date of Birth: 1955-11-11 Referring Provider: Dr. Roda ShuttersXu   Encounter Date: 01/26/2017  PT End of Session - 01/26/17 0945    Visit Number  3    Number of Visits  12    Date for PT Re-Evaluation  02/21/17    Authorization Type  Medicare    PT Start Time  [id number] pt. arrived late     PT Stop Time  1016    PT Time Calculation (min)  38 min    Activity Tolerance  Patient tolerated treatment well    Behavior During Therapy  WFL for tasks assessed/performed       Past Medical History:  Diagnosis Date  . Back problem   . H pylori ulcer     Past Surgical History:  Procedure Laterality Date  . CESAREAN SECTION    . FINGER SURGERY    . HAND SURGERY  august 2016    There were no vitals filed for this visit.  Subjective Assessment - 01/26/17 0941    Subjective  Reports improvement in shoulder pain with reaching behind and overhead motions since last visit.   Reports some "pulling tightness" in anterior neck on Saturday.      Diagnostic tests  none recently    Patient Stated Goals  improve pain, return to exercise    Currently in Pain?  No/denies    Pain Score  0-No pain up to 3-4/10 superior head pain intermittently    Multiple Pain Sites  No                      [facility] Adult PT Treatment/Exercise - 01/26/17 0949      Neck Exercises: Machines for Strengthening   UBE (Upper Arm Bike)  Lvl 2.0, 3 min forward/backwards    Cybex Row  10# x 10 reps       Neck Exercises: Theraband   Shoulder Extension  10 reps yellow TB in door    Rows  Red;15 reps    Rows Limitations  with tactile cueing required for scapular squeeze     Shoulder External Rotation  15 reps yellow TB     Shoulder External Rotation Limitations  Hooklying on pool  noodle     Horizontal ABduction  15 reps    Horizontal ABduction Limitations  Hooklying on pool noodle     Other Theraband Exercises  Standing alternating flexion/extension with yellow TB leaning on 1/2 foam bolster on wall x 10 reps      Manual Therapy   Manual Therapy  Soft tissue mobilization    Manual therapy comments  Sidelying     Soft tissue mobilization  STM to B UT, LS, cervical paraspinals, B medial scapular border; no signficant ttp, however reported relief from "tension"      Neck Exercises: Stretches   Corner Stretch  1 rep;20 seconds    Corner Stretch Limitations  x 20 sec low, mid, high  good tolerance for all              PT Education - 01/26/17 1239    Education provided  Yes    Education Details  row and extension/row with yellow TB     Methods  Demonstration;Verbal cues;Handout    Comprehension  Verbalized understanding;Returned demonstration;Verbal  cues required;Need further instruction          PT Long Term Goals - 01/19/17 1459      PT LONG TERM GOAL #1   Title  patient to be independent with advanced HEP    Status  On-going      PT LONG TERM GOAL #2   Title  patient to demonstrate full cervical and B shoulder AROM with no pain production    Status  On-going      PT LONG TERM GOAL #3   Title  patient to demosntrate good postural awareness and body mechanics as it relates to her daily activities.     Status  On-going      PT LONG TERM GOAL #4   Title  patient to report ability to perform ADLs, exercise, and sleeping with pain no greater than 2/10 for greater than 2 weeks    Status  On-going            Plan - 01/26/17 0946    Clinical Impression Statement  [name] seen to start treatment reporting improvement in shoulder pain levels with overhead reaching and reaching behind back.  Still with intermittent superior "head pain" that "comes and goes" with identifiable pattern.  Tolerated mild progression in scapular strengthening well in session  today however calling clinic shortly after end of therapy reporting some abdominal pain, which she attributes to performing chest doorway stretch from session.  Pt. instructed to not perform doorway stretch HEP until further demo in future visits as not to further irritate abdominal area.  Will monitor pt. status and progress as pt. able in coming visits.      PT Treatment/Interventions  ADLs/Self Care Home Management;Cryotherapy;Electrical Stimulation;Iontophoresis 4mg /ml Dexamethasone;Moist Heat;Traction;Therapeutic exercise;Therapeutic activities;Ultrasound;Neuromuscular re-education;Patient/family education;Manual techniques;Vasopneumatic Device;Taping;Dry needling;Passive range of motion    Consulted and Agree with Plan of Care  Patient       Patient will benefit from skilled therapeutic intervention in order to improve the following deficits and impairments:  Pain, Impaired UE functional use, Decreased strength, Decreased activity tolerance  Visit Diagnosis: Cervicalgia  Abnormal posture  Other symptoms and signs involving the musculoskeletal system     Problem List There are no active problems to display for this patient.   Kermit Balo, PTA 01/26/17 12:44 PM   [facility] [address]  [address] [place], [place], [address] Phone: [phone]   Fax:  [phone]  Name: Durinda Buzzelli MRN: [id number] Date of Birth: May 21, 1955

## 2017-01-30 ENCOUNTER — Ambulatory Visit: Payer: Medicare Other

## 2017-02-01 ENCOUNTER — Encounter: Payer: Medicare Other | Admitting: Physical Therapy

## 2017-02-02 ENCOUNTER — Ambulatory Visit: Payer: Medicare Other

## 2017-02-02 DIAGNOSIS — M542 Cervicalgia: Secondary | ICD-10-CM | POA: Diagnosis not present

## 2017-02-02 DIAGNOSIS — R29898 Other symptoms and signs involving the musculoskeletal system: Secondary | ICD-10-CM

## 2017-02-02 DIAGNOSIS — R293 Abnormal posture: Secondary | ICD-10-CM

## 2017-02-02 NOTE — Therapy (Signed)
[facility] [address]  [place], [place], [address] Phone: [phone]   Fax:  [phone]  Physical Therapy Treatment  Patient Details  Name: Sherry Randall MRN: [id number] Date of Birth: 20-Oct-1955 Referring Provider: Dr. Erlinda Hong   Encounter Date: 02/02/2017  PT End of Session - 02/02/17 1410    Visit Number  4    Number of Visits  12    Date for PT Re-Evaluation  02/21/17    Authorization Type  Medicare    PT Start Time  1406    PT Stop Time  1445    PT Time Calculation (min)  39 min    Activity Tolerance  Patient tolerated treatment well    Behavior During Therapy  [employer] for tasks assessed/performed       Past Medical History:  Diagnosis Date  . Back problem   . H pylori ulcer     Past Surgical History:  Procedure Laterality Date  . CESAREAN SECTION    . FINGER SURGERY    . HAND SURGERY  august 2016    There were no vitals filed for this visit.  Subjective Assessment - 02/02/17 1408    Subjective  Pt. reporting she is, "feeling better today".  Pt. reporting able to "lift groceries" without pain which has previously hurt neck.  Is still having occasional "head pain" which she attributes to the accident.      Diagnostic tests  none recently    Patient Stated Goals  improve pain, return to exercise    Currently in Pain?  No/denies    Pain Score  0-No pain    Multiple Pain Sites  No         [facility] PT Assessment - 02/02/17 1453      AROM   Overall AROM Comments  AROM of cervical spine and B UE WFL - some reported pain at end ranges of cervical motion however pain free with B shoulder AROM                   [facility] Adult PT Treatment/Exercise - 02/02/17 1415      Neck Exercises: Machines for Strengthening   UBE (Upper Arm Bike)  Lvl 2.0, 3 min forward/backwards    Cybex Row  10# x 15 reps     Other Machines for Strengthening  wall pushup x 10 reps      Neck Exercises: Theraband   Shoulder Extension  15 reps    Shoulder Extension Limitations  yellow TB     Rows  Red;15 reps    Rows Limitations  with tactile cueing required for scapular squeeze     Shoulder External Rotation  15 reps yellow TB     Shoulder External Rotation Limitations  leaning on pool noodle on wall       Manual Therapy   Manual Therapy  Soft tissue mobilization    Soft tissue mobilization  STM to R UT as pt. noting some tenderness in this area; good relief following this       Neck Exercises: Stretches   Upper Trapezius Stretch  2 reps;30 seconds    Upper Trapezius Stretch Limitations  bilateral    Levator Stretch  2 reps;30 seconds    Levator Stretch Limitations  bilateral    Corner Stretch  1 rep;20 seconds Low/mid; cues to avoid excessive lumbar lordosis     Corner Stretch Limitations  x 20 sec low, mid; cues to avoid  excessive lumbar lordosis     Chest Stretch  60 seconds;1 rep    Chest Stretch Limitations  Hooklying on pool noodle chest stretch with overpressure from therapist                   PT Long Term Goals - 02/02/17 1452      PT LONG TERM GOAL #1   Title  patient to be independent with advanced HEP    Status  Partially Met met for current       PT LONG TERM GOAL #2   Title  patient to demonstrate full cervical and B shoulder AROM with no pain production    Status  Partially Met Able to demo B shoulder AROM without pain       PT LONG TERM GOAL #3   Title  patient to demosntrate good postural awareness and body mechanics as it relates to her daily activities.     Status  On-going      PT LONG TERM GOAL #4   Title  patient to report ability to perform ADLs, exercise, and sleeping with pain no greater than 2/10 for greater than 2 weeks    Status  On-going No longer reporting sleep disturbances, and has had ability to perform ADL's without pain for 5 days            Plan - 02/02/17 1410    Clinical Impression Statement  [name] reporting she has, "felt better"  the last five days.  Able to demo B shoulder AROM WFL without pain today however still with report of pain at end range of cervical motions today.  Cervical AROM remains WFL.  [name] asking to review proper technique with doorway pec stretch today as she felt abdominal soreness after last visit, which she attributes to poor technique with this stretch.  Cueing to avoid excessive lumbar lordosis with doorway stretch today with pt. demonstrating understanding.  Pt. still demonstrating poor posture with rounded shoulders in treatment frequently however is able to correct with cueing.  Will continue to progress toward goals in coming visits.      PT Treatment/Interventions  ADLs/Self Care Home Management;Cryotherapy;Electrical Stimulation;Iontophoresis 70m/ml Dexamethasone;Moist Heat;Traction;Therapeutic exercise;Therapeutic activities;Ultrasound;Neuromuscular re-education;Patient/family education;Manual techniques;Vasopneumatic Device;Taping;Dry needling;Passive range of motion    Consulted and Agree with Plan of Care  Patient       Patient will benefit from skilled therapeutic intervention in order to improve the following deficits and impairments:  Pain, Impaired UE functional use, Decreased strength, Decreased activity tolerance  Visit Diagnosis: Cervicalgia  Abnormal posture  Other symptoms and signs involving the musculoskeletal system     Problem List There are no active problems to display for this patient.  MBess Harvest PTA 02/02/17 3:15 PM  [facility] [address] [place] [address]Phone: [phone]  Fax:  [phone] Name: Sherry EspinolaMRN: [id number] of Birth: [birth year]

## 2017-02-06 ENCOUNTER — Ambulatory Visit: Payer: Medicare Other

## 2017-02-06 DIAGNOSIS — R29898 Other symptoms and signs involving the musculoskeletal system: Secondary | ICD-10-CM

## 2017-02-06 DIAGNOSIS — R293 Abnormal posture: Secondary | ICD-10-CM

## 2017-02-06 DIAGNOSIS — M542 Cervicalgia: Secondary | ICD-10-CM | POA: Diagnosis not present

## 2017-02-06 NOTE — Therapy (Signed)
[facility] [address]  [place], [place], [address] Phone: [phone]   Fax:  [phone]  Physical Therapy Treatment  Patient Details  Name: Sherry Randall MRN: [id number] Date of Birth: 06/07/1955 Referring Provider: Dr. Erlinda Hong   Encounter Date: 02/06/2017  PT End of Session - 02/06/17 1028    Visit Number  5    Number of Visits  12    Date for PT Re-Evaluation  02/21/17    Authorization Type  Medicare    PT Start Time  1020    PT Stop Time  1100    PT Time Calculation (min)  40 min    Activity Tolerance  Patient tolerated treatment well    Behavior During Therapy  [facility] for tasks assessed/performed       Past Medical History:  Diagnosis Date  . Back problem   . H pylori ulcer     Past Surgical History:  Procedure Laterality Date  . CESAREAN SECTION    . FINGER SURGERY    . HAND SURGERY  august 2016    There were no vitals filed for this visit.  Subjective Assessment - 02/06/17 1025    Subjective  Pt. reporting some "Sinus discomfort" today however reports she had to get another round of antibiotics to help with sinus congestion.  No longer reporting any limitation with daily activities with regard to pain.      Diagnostic tests  none recently    Patient Stated Goals  improve pain, return to exercise    Currently in Pain?  No/denies    Pain Score  0-No pain up to 4/10 occasional "ache" with no known trigger     Pain Location  Abdomen    Pain Orientation  Anterior;Left;Lower    Pain Descriptors / Indicators  Aching    Pain Type  Acute pain    Aggravating Factors   exertion    Multiple Pain Sites  No         [facility] PT Assessment - 02/06/17 1050      AROM   Overall AROM Comments  AROM of cervical spine and B UE WFL - pain free                   [facility] Adult PT Treatment/Exercise - 02/06/17 1033      Self-Care   Self-Care  Other Self-Care Comments    Other Self-Care Comments    Discussion of proper body mechanics and posture with daily tasks as to reduce lumbar and cervical strain; focused on vacuuming as pt. has had somewhat recent "mid back pain" with this; brief discussion of proper machine use at [facility] as pt. with plans to attend regularly post-d/c      Neck Exercises: Machines for Strengthening   UBE (Upper Arm Bike)  Lvl 2.0, 3 min forward/backwards    Other Machines for Strengthening  wall pushup x 10 reps      Neck Exercises: Theraband   Shoulder Extension  15 reps    Shoulder Extension Limitations  yellow TB     Rows  Red;15 reps    Rows Limitations  with tactile cueing required for scapular squeeze     Shoulder External Rotation  10 reps    Shoulder External Rotation Limitations  leaning on 1/2 foam bolster on wall     Horizontal ABduction  10 reps;Red    Horizontal ABduction Limitations  leaning on 1/2 foam bolster on wall  Other Theraband Exercises  Standing alternating flexion/extension with yellow TB leaning on 1/2 foam bolster on wall x 10 reps      Manual Therapy   Manual Therapy  Soft tissue mobilization    Manual therapy comments  seated     Soft tissue mobilization  STM to L-sided mid back; with near "instant" relief following report of onset of pain with wall pushups              PT Education - 02/06/17 1224    Education provided  Yes    Education Details  B shoulder ER with red TB     Person(s) Educated  Patient    Methods  Explanation;Demonstration;Verbal cues;Handout    Comprehension  Verbalized understanding;Returned demonstration;Verbal cues required;Need further instruction          PT Long Term Goals - 02/06/17 1036      PT LONG TERM GOAL #1   Title  patient to be independent with advanced HEP    Status  Partially Met met for current       PT LONG TERM GOAL #2   Title  patient to demonstrate full cervical and B shoulder AROM with no pain production    Status  Achieved      PT LONG TERM GOAL #3   Title   patient to demosntrate good postural awareness and body mechanics as it relates to her daily activities.     Status  On-going      PT LONG TERM GOAL #4   Title  patient to report ability to perform ADLs, exercise, and sleeping with pain no greater than 2/10 for greater than 2 weeks    Status  On-going No longer reporting sleep disturbances, and has had ability to perform ADL's without pain for 9 days.  Had some mid back pain whle vacuuming 9 days ago.              Plan - 02/06/17 1029    Clinical Impression Statement  [name] doing well today.  Primary complaint today was occasional "abdominal" ache without known trigger, which self-resolves and lasts short duration.  Unable to reproduce abdominal pain in session today.  [name] tolerated all activities in treatment well today and able to demo symmetrical shoulder and cervical ROM without pain.  Some discussion regarding proper body mechanics with household tasks as pt. with some recent "mid back pain" while vacuuming a few weeks ago.  Discussed trying to wrap up POC next visit with final HEP review as pt. on track to meet remaining goals and open to idea of transitioning to home/gym program today.  Will plan for final HEP/gym program review and final goal testing next visit.    PT Treatment/Interventions  ADLs/Self Care Home Management;Cryotherapy;Electrical Stimulation;Iontophoresis 6m/ml Dexamethasone;Moist Heat;Traction;Therapeutic exercise;Therapeutic activities;Ultrasound;Neuromuscular re-education;Patient/family education;Manual techniques;Vasopneumatic Device;Taping;Dry needling;Passive range of motion    Consulted and Agree with Plan of Care  Patient       Patient will benefit from skilled therapeutic intervention in order to improve the following deficits and impairments:  Pain, Impaired UE functional use, Decreased strength, Decreased activity tolerance  Visit Diagnosis: Cervicalgia  Abnormal posture  Other symptoms and signs  involving the musculoskeletal system     Problem List There are no active problems to display for this patient.   MBess Harvest PTA 02/06/17 12:37 PM  [facility] [address] [place] [address]Phone: [phone]  Fax:  [phone]  Name: Lusia Greis MRN: [id number] Date of Birth: Jan 08, 1956

## 2017-02-06 NOTE — Patient Instructions (Addendum)

## 2017-02-08 ENCOUNTER — Encounter: Payer: Medicare Other | Admitting: Physical Therapy

## 2017-02-09 ENCOUNTER — Ambulatory Visit: Payer: Medicare Other | Admitting: Physical Therapy

## 2017-02-13 ENCOUNTER — Ambulatory Visit: Payer: Medicare Other

## 2017-02-13 DIAGNOSIS — M542 Cervicalgia: Secondary | ICD-10-CM | POA: Diagnosis not present

## 2017-02-13 DIAGNOSIS — R293 Abnormal posture: Secondary | ICD-10-CM

## 2017-02-13 DIAGNOSIS — R29898 Other symptoms and signs involving the musculoskeletal system: Secondary | ICD-10-CM

## 2017-02-13 NOTE — Therapy (Signed)
[facility] [address]  [place], [place], [address] Phone: [phone]   Fax:  [phone]  Physical Therapy Treatment  Patient Details  Name: Sherry Randall MRN: [id number] Date of Birth: 30-Mar-1955 Referring Provider: Dr. Erlinda Hong   Encounter Date: 02/13/2017  PT End of Session - 02/13/17 1020    Visit Number  6    Number of Visits  12    Date for PT Re-Evaluation  02/21/17    Authorization Type  Medicare    PT Start Time  1017    PT Stop Time  1058    PT Time Calculation (min)  41 min    Activity Tolerance  Patient tolerated treatment well    Behavior During Therapy  [facility] for tasks assessed/performed       Past Medical History:  Diagnosis Date  . Back problem   . H pylori ulcer     Past Surgical History:  Procedure Laterality Date  . CESAREAN SECTION    . FINGER SURGERY    . HAND SURGERY  august 2016    There were no vitals filed for this visit.  Subjective Assessment - 02/13/17 1019    Subjective  Pt. doing well today.  Feels comfortable transitioning to home program following today for 30-day hold from therapy.      Diagnostic tests  none recently    Patient Stated Goals  improve pain, return to exercise    Currently in Pain?  No/denies    Pain Score  0-No pain    Multiple Pain Sites  No         [facility] PT Assessment - 02/13/17 1106      Observation/Other Assessments   Focus on Therapeutic Outcomes (FOTO)   65% (35% limitation)      AROM   Overall AROM Comments  AROM of cervical spine and B UE WFL - pain free                   [facility] Adult PT Treatment/Exercise - 02/13/17 1021      Self-Care   Self-Care  Other Self-Care Comments    Other Self-Care Comments   Review of comprehensive HEP/gym program with pt. verbalizing understanding       Neck Exercises: Machines for Strengthening   UBE (Upper Arm Bike)  Lvl 2.0, 3 min forward/backwards    Cybex Row  10# x 15 reps     Other  Machines for Strengthening  wall pushup x 15 reps    Other Machines for Strengthening  BATCA pulldown machine 10# x 15 reps       Neck Exercises: Standing   Upper Extremity D1  Flexion;Extension;10 reps;Theraband    Theraband Level (UE D1)  Level 1 (Yellow)    Upper Extremity D2  Flexion;Extension;10 reps;Theraband    Theraband Level (UE D2)  Level 1 (Yellow)    Other Standing Exercises  B shoulder ER with yellow TB 3" x 10 reps       Lumbar Exercises: Machines for Strengthening   Leg Press  B LE's 20# x 15 reps              PT Education - 02/13/17 1114    Education provided  Yes    Education Details  machine row, pulldown     Person(s) Educated  Patient    Methods  Explanation;Demonstration;Verbal cues;Handout    Comprehension  Verbalized understanding;Returned demonstration;Verbal cues required;Need further instruction  PT Long Term Goals - 02/13/17 1023      PT LONG TERM GOAL #1   Title  patient to be independent with advanced HEP    Status  Partially Met met for current       PT LONG TERM GOAL #2   Title  patient to demonstrate full cervical and B shoulder AROM with no pain production    Status  Achieved      PT LONG TERM GOAL #3   Title  patient to demosntrate good postural awareness and body mechanics as it relates to her daily activities.     Status  Partially Met Able to demo more consistent proper posture in seated however still requiring some cueing for retracted shoulders.       PT LONG TERM GOAL #4   Title  patient to report ability to perform ADLs, exercise, and sleeping with pain no greater than 2/10 for greater than 2 weeks    Status  Achieved No longer reporting sleep disturbances, and has had ability to perform ADL's without pain for 9 days.  Had some mid back pain whle vacuuming 9 days ago.              Plan - 02/13/17 1024    Clinical Impression Statement  Sherry Randall seen to start treatment reporting she feels comfortable transitioning to  home program following today's visit.  Sherry Randall continues to demo symmetrical cervical/shoulder ROM WFL today without pain.  Reports no recent sleep disturbances or pain with household ADL's over past two weeks.  Pt. able to demo more consistent proper sitting/standing posture in treatment now.  Pt. able to meet or partially meet all therapy goals at this point.  Possibility of 30-day hold discussed with supervising PT with PT agreeing to this.  Comprehensive HEP/gym program reviewed with pt. today with pt. verbalizing understanding.  Pt. now on 30-day hold from therapy.      PT Treatment/Interventions  ADLs/Self Care Home Management;Cryotherapy;Electrical Stimulation;Iontophoresis 64m/ml Dexamethasone;Moist Heat;Traction;Therapeutic exercise;Therapeutic activities;Ultrasound;Neuromuscular re-education;Patient/family education;Manual techniques;Vasopneumatic Device;Taping;Dry needling;Passive range of motion    PT Next Visit Plan  pt. now on 30-day hold from therapy    Consulted and Agree with Plan of Care  Patient       Patient will benefit from skilled therapeutic intervention in order to improve the following deficits and impairments:  Pain, Impaired UE functional use, Decreased strength, Decreased activity tolerance  Visit Diagnosis: Cervicalgia  Abnormal posture  Other symptoms and signs involving the musculoskeletal system     Problem List There are no active problems to display for this patient.   MBess Harvest PTA 02/13/17 11:23 AM  [facility] [place] [address]Phone: [phone]  Fax:  [phone] Name: Sherry MabryMRN: [id number] of Birth: [birth year]

## 2017-02-15 ENCOUNTER — Encounter: Payer: Medicare Other | Admitting: Physical Therapy

## 2017-02-16 ENCOUNTER — Ambulatory Visit: Payer: Medicare Other | Admitting: Physical Therapy

## 2017-02-19 ENCOUNTER — Emergency Department (HOSPITAL_BASED_OUTPATIENT_CLINIC_OR_DEPARTMENT_OTHER): Payer: Medicare Other

## 2017-02-19 ENCOUNTER — Emergency Department (HOSPITAL_BASED_OUTPATIENT_CLINIC_OR_DEPARTMENT_OTHER)
Admission: EM | Admit: 2017-02-19 | Discharge: 2017-02-19 | Disposition: A | Discharge status: Home / Self Care | Payer: Medicare Other | Attending: Emergency Medicine | Admitting: Emergency Medicine

## 2017-02-19 ENCOUNTER — Encounter (HOSPITAL_BASED_OUTPATIENT_CLINIC_OR_DEPARTMENT_OTHER): Payer: Self-pay | Admitting: Emergency Medicine

## 2017-02-19 ENCOUNTER — Other Ambulatory Visit: Payer: Self-pay

## 2017-02-19 DIAGNOSIS — R05 Cough: Secondary | ICD-10-CM | POA: Insufficient documentation

## 2017-02-19 DIAGNOSIS — J209 Acute bronchitis, unspecified: Secondary | ICD-10-CM | POA: Diagnosis not present

## 2017-02-19 DIAGNOSIS — Z79899 Other long term (current) drug therapy: Secondary | ICD-10-CM | POA: Diagnosis not present

## 2017-02-19 DIAGNOSIS — R0602 Shortness of breath: Secondary | ICD-10-CM | POA: Diagnosis present

## 2017-02-19 MED ORDER — AZITHROMYCIN 250 MG PO TABS
500.0000 mg | ORAL_TABLET | Freq: Once | ORAL | Status: AC
  Administered 2017-02-19: 500 mg via ORAL
  Filled 2017-02-19: qty 2

## 2017-02-19 MED ORDER — AZITHROMYCIN 250 MG PO TABS: ORAL_TABLET | ORAL | 0 refills | Status: DC

## 2017-02-19 MED ORDER — AEROCHAMBER PLUS FLO-VU MEDIUM MISC
1.0000 | Freq: Once | Status: AC
  Administered 2017-02-19: 1
  Filled 2017-02-19: qty 1

## 2017-02-19 MED ORDER — ALBUTEROL SULFATE HFA 108 (90 BASE) MCG/ACT IN AERS
1.0000 | INHALATION_SPRAY | RESPIRATORY_TRACT | Status: DC | PRN
  Administered 2017-02-19: 2 via RESPIRATORY_TRACT
  Filled 2017-02-19: qty 6.7

## 2017-02-19 NOTE — ED Triage Notes (Signed)
Pt c/o SHOB, cough x 2 days; also reports mid CP intermittently as well

## 2017-02-19 NOTE — ED Provider Notes (Signed)
[facility] EMERGENCY DEPARTMENT Provider Note   CSN: [id number] Arrival date & time: 02/19/17  1548     History   Chief Complaint Chief Complaint  Patient presents with  . Shortness of Breath  . Cough    HPI Sherry Randall is a 62 y.o. female.  Pt presents to the ED today with sob and cough.  She said cough is a "strangle" cough.  [facility] visiting a friend a few days ago and is concerned she got something there.  Pt denies any f/c.      Past Medical History:  Diagnosis Date  . Back problem   . H pylori ulcer     There are no active problems to display for this patient.   Past Surgical History:  Procedure Laterality Date  . CESAREAN SECTION    . FINGER SURGERY    . HAND SURGERY  august 2016    OB History    No data available       Home Medications    Prior to Admission medications   Medication Sig Start Date End Date Taking? Authorizing Provider  acetaminophen (TYLENOL) 160 MG/5ML elixir Take 15.6 mLs (500 mg total) by mouth every 4 (four) hours as needed for fever. 12/29/15   Arby BarrettePfeiffer, Marcy, MD  azithromycin (ZITHROMAX) 250 MG tablet Take 1 every day until finished. 02/20/17   Jacalyn LefevreHaviland, Hinata Diener, MD  BIOTIN PO Take 1 tablet by mouth daily.    [provider]  cholecalciferol (VITAMIN D) 1000 units tablet Take 1,000 Units by mouth daily.    [provider]  cyclobenzaprine (FLEXERIL) 10 MG tablet Take 1 tablet (10 mg total) by mouth 2 (two) times daily as needed for muscle spasms. 05/10/16   Audry PiliMohr, Tyler, PA-C  ibuprofen (ADVIL,MOTRIN) 400 MG tablet Take 1 tablet (400 mg total) by mouth every 6 (six) hours as needed. 05/10/16   Audry PiliMohr, Tyler, PA-C  levofloxacin (LEVAQUIN) 250 MG tablet Take 250 mg by mouth daily.    [provider]  methocarbamol (ROBAXIN) 500 MG tablet Take 1 tablet (500 mg total) by mouth 2 (two) times daily. 06/28/16   Lorre NickAllen, Anthony, MD  Omega-3 Fatty Acids (FISH OIL) 1000 MG CAPS Take 1 capsule  by mouth daily.    [provider]    Family History Family History  Problem Relation Age of Onset  . Cancer Mother        pancreatic     Social History Social History   Tobacco Use  . Smoking status: Never Smoker  . Smokeless tobacco: Never Used  Substance Use Topics  . Alcohol use: No  . Drug use: No     Allergies   Augmentin [amoxicillin-pot clavulanate]; Avelox [moxifloxacin hcl in nacl]; Biaxin [clarithromycin]; Hydrocodone; and Keflet [cephalexin]   Review of Systems Review of Systems  Respiratory: Positive for cough.   All other systems reviewed and are negative.    Physical Exam Updated Vital Signs BP (!) 142/96 (BP Location: Right Arm)   Pulse 68   Temp 98.4 F (36.9 C) (Oral)   Resp 18   Wt 49.9 kg (110 lb)   SpO2 100%   BMI 17.75 kg/m   Physical Exam  Constitutional: She is oriented to person, place, and time. She appears well-developed and well-nourished.  HENT:  Head: Normocephalic and atraumatic.  Mouth/Throat: Oropharynx is clear and moist.  Eyes: EOM are normal. Pupils are equal, round, and reactive to light.  Neck: Normal range of motion.  Neck supple.  Cardiovascular: Normal rate, regular rhythm, normal heart sounds and intact distal pulses.  Pulmonary/Chest: Effort normal and breath sounds normal.  Abdominal: Soft. Bowel sounds are normal.  Musculoskeletal: Normal range of motion.       Right lower leg: Normal.       Left lower leg: Normal.  Neurological: She is alert and oriented to person, place, and time.  Skin: Skin is warm and dry. Capillary refill takes less than 2 seconds.  Psychiatric: She has a normal mood and affect. Her behavior is normal.  Nursing note and vitals reviewed.    ED Treatments / Results  Labs (all labs ordered are listed, but only abnormal results are displayed) Labs Reviewed - No data to display  EKG  EKG Interpretation  Date/Time:  Sunday February 19 2017 16:03:53 EST Ventricular Rate:   79 PR Interval:  114 QRS Duration: 82 QT Interval:  392 QTC Calculation: 449 R Axis:   78 Text Interpretation:  Normal sinus rhythm Nonspecific ST abnormality Abnormal ECG Confirmed by Travaris Kosh ([id number]) on 02/19/2017 4:32:52 PM       Radiology Dg Chest 2 View  Result Date: 02/19/2017 CLINICAL DATA:  Cough, history bronchitis EXAM: CHEST  2 VIEW COMPARISON:  12/29/2015 FINDINGS: Normal heart size, mediastinal contours, and pulmonary vascularity. Lungs mildly hyperinflated but clear. No acute infiltrate, pleural effusion, or pneumothorax. Bones demineralized with biconvex thoracolumbar scoliosis. IMPRESSION: Mildly hyperinflated lungs without acute infiltrate. Electronically Signed   By: Mark  Boles M.D.   On: 02/19/2017 16:32    Procedures Procedures (including critical care time)  Medications Ordered in ED Medications  albuterol (PROVENTIL HFA;VENTOLIN HFA) 108 (90 Base) MCG/ACT inhaler 1-2 puff (not administered)  AEROCHAMBER PLUS FLO-VU MEDIUM MISC 1 each (not administered)  azithromycin (ZITHROMAX) tablet 500 mg (500 mg Oral Given 02/19/17 1735)     Initial Impression / Assessment and Plan / ED Course  I have reviewed the triage vital signs and the nursing notes.  Pertinent labs & imaging results that were available during my care of the patient were reviewed by me and considered in my medical decision making (see chart for details).   no pna.  Pt to be started on zithromax for bronchitis.  She is also given an inhaler with spacer prior to d/c.  She knows to return if worse and f/u with pcp.  Final Clinical Impressions(s) / ED Diagnoses   Final diagnoses:  Acute bronchitis, unspecified organism    ED Discharge Orders        Ordered    azithromycin (ZITHROMAX) 250 MG tablet     01 /27/19 1729       Jacalyn Lefevre, MD 02/19/17 1745

## 2017-03-11 ENCOUNTER — Emergency Department (HOSPITAL_BASED_OUTPATIENT_CLINIC_OR_DEPARTMENT_OTHER)
Admission: EM | Admit: 2017-03-11 | Discharge: 2017-03-11 | Disposition: A | Discharge status: Home / Self Care | Payer: Medicare Other | Attending: Emergency Medicine | Admitting: Emergency Medicine

## 2017-03-11 ENCOUNTER — Other Ambulatory Visit: Payer: Self-pay

## 2017-03-11 DIAGNOSIS — R300 Dysuria: Secondary | ICD-10-CM | POA: Diagnosis present

## 2017-03-11 DIAGNOSIS — Z79899 Other long term (current) drug therapy: Secondary | ICD-10-CM | POA: Insufficient documentation

## 2017-03-11 LAB — URINALYSIS, ROUTINE W REFLEX MICROSCOPIC
Bilirubin Urine: NEGATIVE
Glucose, UA: NEGATIVE mg/dL
Hgb urine dipstick: NEGATIVE
Ketones, ur: NEGATIVE mg/dL
Leukocytes, UA: NEGATIVE
NITRITE: NEGATIVE
Protein, ur: NEGATIVE mg/dL
pH: 6.5 (ref 5.0–8.0)

## 2017-03-11 MED ORDER — ONDANSETRON 4 MG PO TBDP
ORAL_TABLET | ORAL | Status: AC
  Filled 2017-03-11: qty 1

## 2017-03-11 MED ORDER — ONDANSETRON 4 MG PO TBDP
4.0000 mg | ORAL_TABLET | Freq: Once | ORAL | Status: AC
  Administered 2017-03-11: 4 mg via ORAL

## 2017-03-11 NOTE — Discharge Instructions (Signed)
As discussed, make sure that you stay well-hydrated keeping your urine clear.  Follow-up with your primary care provider for further evaluation if symptoms persist. Your urine did not show any bacteria or abnormalities today.  I have sent it for culture and if anything grows on culture we will give you a call to start antibiotics.  I do not think that antibiotics are indicated at this time.  Use mild non-scented soap to prevent any irritation. Return if symptoms worsen or new concerning symptoms in the meantime.

## 2017-03-11 NOTE — ED Triage Notes (Signed)
Pt concerned for UTI. [place] [employer] [place] spl[place] on her and since then she has had Sx

## 2017-03-11 NOTE — ED Provider Notes (Signed)
[facility] EMERGENCY DEPARTMENT Provider Note   CSN: [id number] Arrival date & time: 03/11/17  1918     History   Chief Complaint Chief Complaint  Patient presents with  . Dysuria    HPI Sherry Randall is a 62 y.o. female with past medical history significant for chronic back pain and recurrent UTIs presenting with concerns for UTI after she used a dirty mall restroom. She reports feeling a splash of water to her vagina and know that she has a uti. She explains the her urine typically looks normal when she has utis. She is requesting antibiotics for this. She denies vaginal discharge, bleeding, fever, chills, abdominal pain or other symptoms.  HPI  Past Medical History:  Diagnosis Date  . Back problem   . H pylori ulcer     There are no active problems to display for this patient.   Past Surgical History:  Procedure Laterality Date  . CESAREAN SECTION    . FINGER SURGERY    . HAND SURGERY  august 2016    OB History    No data available       Home Medications    Prior to Admission medications   Medication Sig Start Date End Date Taking? Authorizing Provider  BIOTIN PO Take 1 tablet by mouth daily.   Yes [provider]  cholecalciferol (VITAMIN D) 1000 units tablet Take 1,000 Units by mouth daily.   Yes [provider]  Omega-3 Fatty Acids (FISH OIL) 1000 MG CAPS Take 1 capsule by mouth daily.   Yes [provider]  acetaminophen (TYLENOL) 160 MG/5ML elixir Take 15.6 mLs (500 mg total) by mouth every 4 (four) hours as needed for fever. 12/29/15   Arby BarrettePfeiffer, Marcy, MD  azithromycin (ZITHROMAX) 250 MG tablet Take 1 every day until finished. 02/20/17   Jacalyn LefevreHaviland, Julie, MD  cyclobenzaprine (FLEXERIL) 10 MG tablet Take 1 tablet (10 mg total) by mouth 2 (two) times daily as needed for muscle spasms. 05/10/16   Audry PiliMohr, Tyler, PA-C  ibuprofen (ADVIL,MOTRIN) 400 MG tablet Take 1 tablet (400 mg total) by mouth every 6 (six) hours as needed.  05/10/16   Audry PiliMohr, Tyler, PA-C  levofloxacin (LEVAQUIN) 250 MG tablet Take 250 mg by mouth daily.    [provider]  methocarbamol (ROBAXIN) 500 MG tablet Take 1 tablet (500 mg total) by mouth 2 (two) times daily. 06/28/16   Lorre NickAllen, Anthony, MD    Family History Family History  Problem Relation Age of Onset  . Cancer Mother        pancreatic     Social History Social History   Tobacco Use  . Smoking status: Never Smoker  . Smokeless tobacco: Never Used  Substance Use Topics  . Alcohol use: No  . Drug use: No     Allergies   Augmentin [amoxicillin-pot clavulanate]; Avelox [moxifloxacin hcl in nacl]; Biaxin [clarithromycin]; Hydrocodone; and Keflet [cephalexin]   Review of Systems Review of Systems  Constitutional: Negative for chills and fever.  Gastrointestinal: Negative for vomiting.  Genitourinary: Positive for dysuria. Negative for decreased urine volume, difficulty urinating, frequency, genital sores, hematuria, vaginal bleeding and vaginal discharge.  Musculoskeletal: Negative for myalgias, neck pain and neck stiffness.  Skin: Negative for color change, pallor and rash.     Physical Exam Updated Vital Signs BP 128/74   Pulse 63   Temp 98.3 F (36.8 C) (Oral)   Resp 20   SpO2 99%   Physical Exam  Constitutional: She appears well-developed and  well-nourished. No distress.  Afebrile, nontoxic appearing in no acute distress, sitting comfortably in chair  HENT:  Head: Atraumatic.  Eyes: EOM are normal. Right eye exhibits no discharge. Left eye exhibits no discharge. No scleral icterus.  Neck: Normal range of motion.  Cardiovascular: Normal rate.  Pulmonary/Chest: Effort normal. No respiratory distress.  Musculoskeletal: Normal range of motion.  Neurological: She is alert.  Skin: No rash noted. She is not diaphoretic. No erythema. No pallor.  Psychiatric: She has a normal mood and affect. Her behavior is normal.  Nursing note and vitals  reviewed.    ED Treatments / Results  Labs (all labs ordered are listed, but only abnormal results are displayed) Labs Reviewed  URINALYSIS, ROUTINE W REFLEX MICROSCOPIC - Abnormal; Notable for the following components:      Result Value   Specific Gravity, Urine <1.005 (*)    All other components within normal limits  URINE CULTURE    EKG  EKG Interpretation None       Radiology No results found.  Procedures Procedures (including critical care time)  Medications Ordered in ED Medications  ondansetron (ZOFRAN-ODT) 4 MG disintegrating tablet (not administered)  ondansetron (ZOFRAN-ODT) disintegrating tablet 4 mg (4 mg Oral Given 03/11/17 2117)     Initial Impression / Assessment and Plan / ED Course  I have reviewed the triage vital signs and the nursing notes.  Pertinent labs & imaging results that were available during my care of the patient were reviewed by me and considered in my medical decision making (see chart for details).     Her urine did not show any signs of UTI. I explained to patient that I would send it for culture and that if anything came back positive, we would contact her to start antibiotics but that antibiotics were not indicated at this time.  Patient seemed strongly convinced that she must have a uti. I explained that she could be experiencing symptoms due to vulvar irritation or sti and offered to have her moved to a full room for exam, but patient refused and stated that she would see her PCP on Monday.  She later reported nausea and requested phenergan and specified to her nurse that she did not want zofran.  Patient was displaying odd possibly drug seeking behaviors.  Offered zofran. Patient was discharged with close PCP follow up on Monday. She was well-appearing, non-toxic, afebrile.  Return precautions were discussed and patient understood and agreed with discharge plan.  Final Clinical Impressions(s) / ED Diagnoses   Final  diagnoses:  Dysuria    ED Discharge Orders    None       Gregary Cromer 03/11/17 2151    Gwyneth Sprout, MD 03/11/17 [phone]

## 2017-03-13 LAB — URINE CULTURE: Culture: <10000 — AB

## 2017-03-14 ENCOUNTER — Encounter (HOSPITAL_BASED_OUTPATIENT_CLINIC_OR_DEPARTMENT_OTHER): Payer: Self-pay | Admitting: Emergency Medicine

## 2017-03-14 ENCOUNTER — Emergency Department (HOSPITAL_BASED_OUTPATIENT_CLINIC_OR_DEPARTMENT_OTHER)
Admission: EM | Admit: 2017-03-14 | Discharge: 2017-03-14 | Disposition: A | Discharge status: Home / Self Care | Payer: Medicare Other | Attending: Emergency Medicine | Admitting: Emergency Medicine

## 2017-03-14 ENCOUNTER — Other Ambulatory Visit: Payer: Self-pay

## 2017-03-14 DIAGNOSIS — R350 Frequency of micturition: Secondary | ICD-10-CM | POA: Insufficient documentation

## 2017-03-14 DIAGNOSIS — Z79899 Other long term (current) drug therapy: Secondary | ICD-10-CM | POA: Diagnosis not present

## 2017-03-14 DIAGNOSIS — R110 Nausea: Secondary | ICD-10-CM | POA: Diagnosis not present

## 2017-03-14 LAB — CBC WITH DIFFERENTIAL/PLATELET
BASOS PCT: 0 %
Basophils Absolute: 0 10*3/uL (ref 0.0–0.1)
EOS PCT: 7 %
Eosinophils Absolute: 0.4 10*3/uL (ref 0.0–0.7)
HEMATOCRIT: 38.7 % (ref 36.0–46.0)
Hemoglobin: 12.9 g/dL (ref 12.0–15.0)
Lymphocytes Relative: 48 %
Lymphs Abs: 2.7 10*3/uL (ref 0.7–4.0)
MCH: 29.8 pg (ref 26.0–34.0)
MCHC: 33.3 g/dL (ref 30.0–36.0)
MCV: 89.4 fL (ref 78.0–100.0)
Monocytes Absolute: 0.6 10*3/uL (ref 0.1–1.0)
Monocytes Relative: 10 %
NEUTROS ABS: 2 10*3/uL (ref 1.7–7.7)
Neutrophils Relative %: 35 %
PLATELETS: 252 10*3/uL (ref 150–400)
RBC: 4.33 MIL/uL (ref 3.87–5.11)
RDW: 14.2 % (ref 11.5–15.5)
WBC: 5.6 10*3/uL (ref 4.0–10.5)

## 2017-03-14 LAB — URINALYSIS, ROUTINE W REFLEX MICROSCOPIC
Bilirubin Urine: NEGATIVE
Glucose, UA: NEGATIVE mg/dL
KETONES UR: NEGATIVE mg/dL
Leukocytes, UA: NEGATIVE
NITRITE: NEGATIVE
PROTEIN: NEGATIVE mg/dL
Specific Gravity, Urine: 1.01 (ref 1.005–1.030)
pH: 6 (ref 5.0–8.0)

## 2017-03-14 LAB — WET PREP, GENITAL
Clue Cells Wet Prep HPF POC: NONE SEEN
Sperm: NONE SEEN
Trich, Wet Prep: NONE SEEN
WBC, Wet Prep HPF POC: NONE SEEN
Yeast Wet Prep HPF POC: NONE SEEN

## 2017-03-14 LAB — BASIC METABOLIC PANEL
ANION GAP: 7 (ref 5–15)
BUN: 12 mg/dL (ref 6–20)
CALCIUM: 8.7 mg/dL — AB (ref 8.9–10.3)
CO2: 28 mmol/L (ref 22–32)
Chloride: 103 mmol/L (ref 101–111)
Creatinine, Ser: 0.59 mg/dL (ref 0.44–1.00)
Glucose, Bld: 95 mg/dL (ref 65–99)
Potassium: 4.1 mmol/L (ref 3.5–5.1)
Sodium: 138 mmol/L (ref 135–145)

## 2017-03-14 LAB — URINALYSIS, MICROSCOPIC (REFLEX)
Bacteria, UA: NONE SEEN
Squamous Epithelial / LPF: NONE SEEN

## 2017-03-14 MED ORDER — PHENAZOPYRIDINE HCL 100 MG PO TABS: 100.0000 mg | ORAL_TABLET | Freq: Three times a day (TID) | ORAL | 0 refills | Status: DC | PRN

## 2017-03-14 MED ORDER — PHENAZOPYRIDINE HCL 100 MG PO TABS
100.0000 mg | ORAL_TABLET | Freq: Once | ORAL | Status: DC
  Filled 2017-03-14: qty 1

## 2017-03-14 MED ORDER — ONDANSETRON 4 MG PO TBDP
4.0000 mg | ORAL_TABLET | Freq: Once | ORAL | Status: AC
  Administered 2017-03-14: 4 mg via ORAL
  Filled 2017-03-14: qty 1

## 2017-03-14 MED ORDER — PHENAZOPYRIDINE HCL 100 MG PO TABS: 100.0000 mg | ORAL_TABLET | Freq: Once | ORAL | Status: DC

## 2017-03-14 NOTE — Discharge Instructions (Signed)
You were seen today for urinary frequency and nausea.  Follow-up closely with your primary physician and your urologist.  At this time there is no evidence of active urinary tract infection.  The cause of your symptoms at this time is unknown but follow-up is important.  If you develop fevers, new or worsening symptoms you should be reevaluated immediately.

## 2017-03-14 NOTE — ED Notes (Signed)
Pt states, "I know I have a urinary tract infection, and I want to see a doctor instead of a PA." pt taken to treatment room and given warm blankets per request.

## 2017-03-14 NOTE — ED Provider Notes (Signed)
[facility] EMERGENCY DEPARTMENT Provider Note   CSN: [id number] Arrival date & time: 03/14/17  [id number]     History   Chief Complaint Chief Complaint  Patient presents with  . Urinary Frequency    HPI Sherry Randall is a 62 y.o. female.  HPI  This is a 62 year old female with a history of H. pylori, chronic UTI, chronic back pain who presents with urinary frequency.  Patient reports onset of urinary frequency on Sunday.  She was seen and evaluated here.  Workup was negative.  She states she has had persistent symptoms.  She reports suprapubic pressure and urinary frequency.  No dysuria hematuria.  She reports that she feels she contracted "some bug" from using the toilet at the mall.  She denies any fevers.  She does report "mild" low back pain.  She also reports nausea.  She states this is abnormal for her.  She states her symptoms are very consistent with her prior urinary tract infections.  She states "Cipro always takes this away."  She also states "if you not any give me antibiotics, I want a CT scan to figure out what is going wrong."  Currently she rates her discomfort at 6.  She has taken Tylenol at home with minimal improvement.  Denies vaginal discharge.  Denies being currently sexually active.  Past Medical History:  Diagnosis Date  . Back problem   . H pylori ulcer     There are no active problems to display for this patient.   Past Surgical History:  Procedure Laterality Date  . CESAREAN SECTION    . FINGER SURGERY    . HAND SURGERY  august 2016    OB History    No data available       Home Medications    Prior to Admission medications   Medication Sig Start Date End Date Taking? Authorizing Provider  acetaminophen (TYLENOL) 160 MG/5ML elixir Take 15.6 mLs (500 mg total) by mouth every 4 (four) hours as needed for fever. 12/29/15   Arby BarrettePfeiffer, Marcy, MD  azithromycin (ZITHROMAX) 250 MG tablet Take 1 every day until finished. 02/20/17   Jacalyn LefevreHaviland,  Julie, MD  BIOTIN PO Take 1 tablet by mouth daily.    [provider]  cholecalciferol (VITAMIN D) 1000 units tablet Take 1,000 Units by mouth daily.    [provider]  cyclobenzaprine (FLEXERIL) 10 MG tablet Take 1 tablet (10 mg total) by mouth 2 (two) times daily as needed for muscle spasms. 05/10/16   Audry PiliMohr, Tyler, PA-C  ibuprofen (ADVIL,MOTRIN) 400 MG tablet Take 1 tablet (400 mg total) by mouth every 6 (six) hours as needed. 05/10/16   Audry PiliMohr, Tyler, PA-C  levofloxacin (LEVAQUIN) 250 MG tablet Take 250 mg by mouth daily.    [provider]  methocarbamol (ROBAXIN) 500 MG tablet Take 1 tablet (500 mg total) by mouth 2 (two) times daily. 06/28/16   Lorre NickAllen, Anthony, MD  Omega-3 Fatty Acids (FISH OIL) 1000 MG CAPS Take 1 capsule by mouth daily.    [provider]    Family History Family History  Problem Relation Age of Onset  . Cancer Mother        pancreatic     Social History Social History   Tobacco Use  . Smoking status: Never Smoker  . Smokeless tobacco: Never Used  Substance Use Topics  . Alcohol use: No  . Drug use: No     Allergies   Augmentin [amoxicillin-pot clavulanate]; Avelox [moxifloxacin hcl in  nacl]; Biaxin [clarithromycin]; Hydrocodone; and Keflet [cephalexin]   Review of Systems Review of Systems  Constitutional: Negative for fever.  Respiratory: Negative for shortness of breath.   Cardiovascular: Negative for chest pain.  Gastrointestinal: Positive for nausea. Negative for diarrhea and vomiting.  Genitourinary: Positive for frequency and urgency. Negative for dysuria, hematuria and vaginal discharge.  All other systems reviewed and are negative.    Physical Exam Updated Vital Signs BP 119/65 (BP Location: Left Arm)   Pulse 65   Temp 98 F (36.7 C) (Oral)   Resp 16   Wt 49.9 kg (110 lb)   SpO2 100%   BMI 17.75 kg/m   Physical Exam  Constitutional: She is oriented to person, place, and time. She appears  well-developed and well-nourished. No distress.  HENT:  Head: Normocephalic and atraumatic.  Cardiovascular: Normal rate, regular rhythm and normal heart sounds.  Pulmonary/Chest: Effort normal and breath sounds normal. No respiratory distress. She has no wheezes.  Abdominal: Soft. Bowel sounds are normal. There is no tenderness. There is no rebound and no guarding.  Genitourinary:  Genitourinary Comments: No CVA tenderness No external vaginal lesions, no significant vaginal discharge  Neurological: She is alert and oriented to person, place, and time.  Skin: Skin is warm and dry.  Psychiatric: She has a normal mood and affect.  Nursing note and vitals reviewed.    ED Treatments / Results  Labs (all labs ordered are listed, but only abnormal results are displayed) Labs Reviewed  BASIC METABOLIC PANEL - Abnormal; Notable for the following components:      Result Value   Calcium 8.7 (*)    All other components within normal limits  URINALYSIS, ROUTINE W REFLEX MICROSCOPIC - Abnormal; Notable for the following components:   Hgb urine dipstick TRACE (*)    All other components within normal limits  WET PREP, GENITAL  URINE CULTURE  CBC WITH DIFFERENTIAL/PLATELET  URINALYSIS, MICROSCOPIC (REFLEX)    EKG  EKG Interpretation None       Radiology No results found.  Procedures Procedures (including critical care time)  Medications Ordered in ED Medications  phenazopyridine (PYRIDIUM) tablet 100 mg (100 mg Oral Refused 03/14/17 0456)  phenazopyridine (PYRIDIUM) tablet 100 mg (not administered)  ondansetron (ZOFRAN-ODT) disintegrating tablet 4 mg (4 mg Oral Given 03/14/17 0409)     Initial Impression / Assessment and Plan / ED Course  I have reviewed the triage vital signs and the nursing notes.  Pertinent labs & imaging results that were available during my care of the patient were reviewed by me and considered in my medical decision making (see chart for  details).  Clinical Course as of Mar 14 537  Tue Mar 14, 2017  0507 Patient seems unhappy not to have a diagnosis or receive antibiotics.  She states she has had several episodes of similar symptoms that have resolved with ciprofloxacin.  Given recent negative urine culture and persistently clean urine on my evaluation, I do not feel that that I would be a good steward of antibiotics to give her antibiotics.  This was discussed with her at length.  I have offered her Pyridium which she declines.  I have also discussed with her follow-up with her urologist.  She could have interstitial cystitis or similar entity that is related to inflammation and not acute infection.  [CH]    Clinical Course User Index [CH] Keinan Brouillet, Mayer Masker, MD    Patient presents with persistent urinary frequency.  She is overall nontoxic appearing  on exam.  Vital signs reassuring.  Afebrile.  Exam is benign including pelvic exam.  She has no reproducible tenderness on exam or CVA tenderness to suggest pyelonephritis.  Chart reviewed and urine culture from Sunday grew out less than 10,000.  Urine today appears clean.  She has no evidence of leukocytosis or renal dysfunction.  Given benign exam and reassuring lab workup, the patient was reassured.  See discussion above.  She is insistent that she should be treated with antibiotics.  I do not feel at this time that there is an indication for treatment.  I do think that follow-up with her urologist for further evaluation and possible evaluation for interstitial cystitis or some other non-infectious cause is reasonable.  At this time no indication for further imaging.  Patient was offered Pyridium at discharge.  After history, exam, and medical workup I feel the patient has been appropriately medically screened and is safe for discharge home. Pertinent diagnoses were discussed with the patient. Patient was given return precautions.   Final Clinical Impressions(s) / ED Diagnoses    Final diagnoses:  Urinary frequency  Nausea    ED Discharge Orders    None       Shon Baton, MD 03/14/17 [phone]

## 2017-03-14 NOTE — ED Triage Notes (Signed)
Pt c/o urinary frequency, left lower back pain, chills and nausea. Pt states she was seen here sun for same.

## 2017-03-15 ENCOUNTER — Encounter: Payer: Self-pay | Admitting: Physical Therapy

## 2017-03-15 ENCOUNTER — Ambulatory Visit: Payer: Medicare Other | Attending: Orthopaedic Surgery | Admitting: Physical Therapy

## 2017-03-15 DIAGNOSIS — R293 Abnormal posture: Secondary | ICD-10-CM | POA: Diagnosis present

## 2017-03-15 DIAGNOSIS — R29898 Other symptoms and signs involving the musculoskeletal system: Secondary | ICD-10-CM | POA: Diagnosis present

## 2017-03-15 DIAGNOSIS — M542 Cervicalgia: Secondary | ICD-10-CM | POA: Diagnosis present

## 2017-03-15 LAB — URINE CULTURE: Culture: NO GROWTH

## 2017-03-15 NOTE — Therapy (Signed)
[facility] [address]  [place], [place], [address] Phone: [phone]   Fax:  [phone]  Physical Therapy Treatment  Patient Details  Name: Sherry Randall MRN: [id number] Date of Birth: 03-09-1955 Referring Provider: Dr. Erlinda Hong   Encounter Date: 03/15/2017  PT End of Session - 03/15/17 1555    Visit Number  7    Number of Visits  12    Authorization Type  Medicare    PT Start Time  1400    PT Stop Time  1439    PT Time Calculation (min)  39 min    Activity Tolerance  Patient tolerated treatment well    Behavior During Therapy  [facility] for tasks assessed/performed       Past Medical History:  Diagnosis Date  . Back problem   . H pylori ulcer     Past Surgical History:  Procedure Laterality Date  . CESAREAN SECTION    . FINGER SURGERY    . HAND SURGERY  august 2016    There were no vitals filed for this visit.  Subjective Assessment - 03/15/17 1402    Subjective  Patient reporting intermittent pains at neck, head, and upper mid back. No known reason for pain or triggers. Patient reports low stress and has not been lifting. Looking to the R as well as flexion and extension causes pain.     Currently in Pain?  Yes    Pain Score  5     Pain Location  Neck    Pain Orientation  Medial;Posterior;Lower    Pain Descriptors / Indicators  Nagging;Tightness    Pain Type  Chronic pain    Pain Relieving Factors  anti-inflammatory medicine         [facility] PT Assessment - 03/15/17 0001      Assessment   Medical Diagnosis  Neck pain    Referring Provider  Dr. Erlinda Hong    Next MD Visit  no follow-ups planned      Precautions   Precautions  None      Restrictions   Weight Bearing Restrictions  No      Prior Function   Level of Independence  Independent    Vocation  Retired    Leisure  exercise, stretching, shopping      Cognition   Overall Cognitive Status  Within Functional Limits for tasks assessed      Coordination   Gross Motor Movements are Fluid and Coordinated  Yes      Posture/Postural Control   Posture/Postural Control  Postural limitations    Postural Limitations  Rounded Shoulders;Forward head      AROM   Overall AROM Comments  Full and symmetrical AROM motion at C-spine and B UE - slight pain provocation at posterior neck and upper mid back.       Palpation   Palpation comment  noted trigger point at R UT and R infraspinatus                  [facility] Adult PT Treatment/Exercise - 03/15/17 0001      Neck Exercises: Theraband   Shoulder External Rotation  10 reps;Red    Horizontal ADduction  10 reps;Red      Manual Therapy   Manual Therapy  Soft tissue mobilization;Myofascial release    Manual therapy comments  patient hooklying with bolster    Soft tissue mobilization  STM to B UT, B LS, B infraspinatus, B cervical paraspinals,  B suboccipital    Myofascial Release  manual trigger point release to R UT and R infraspinatus      Neck Exercises: Stretches   Levator Stretch  Right;1 rep;60 seconds    Other Neck Stretches  self massage with ball to R/L upper back with good relief                  PT Long Term Goals - 02/13/17 1023      PT LONG TERM GOAL #1   Title  patient to be independent with advanced HEP    Status  Partially Met met for current       PT LONG TERM GOAL #2   Title  patient to demonstrate full cervical and B shoulder AROM with no pain production    Status  Achieved      PT LONG TERM GOAL #3   Title  patient to demosntrate good postural awareness and body mechanics as it relates to her daily activities.     Status  Partially Met Able to demo more consistent proper posture in seated however still requiring some cueing for retracted shoulders.       PT LONG TERM GOAL #4   Title  patient to report ability to perform ADLs, exercise, and sleeping with pain no greater than 2/10 for greater than 2 weeks    Status  Achieved No longer  reporting sleep disturbances, and has had ability to perform ADL's without pain for 9 days.  Had some mid back pain whle vacuuming 9 days ago.              Plan - 03/15/17 1555    Clinical Impression Statement  [name] returning to PT at near end of 30 day hold - questions regarding independent progress and intermittent pain she has been having. Full and symmetrical cervical and B UE AROM today with only slight pain provocation at mid back. patient also with noted trigger point at R UT and R infraspinatus with good tolerance to STM and manual trigger point release with patient stating 0/10 pain following STM. Patient and PT discussing POC with patient comfortable for 1 time visit today. Education on self massage and continued stretching and strenghtening for good functional carryover with good understanding. Will plan to d.c from PT today.     PT Treatment/Interventions  ADLs/Self Care Home Management;Cryotherapy;Electrical Stimulation;Iontophoresis 81m/ml Dexamethasone;Moist Heat;Traction;Therapeutic exercise;Therapeutic activities;Ultrasound;Neuromuscular re-education;Patient/family education;Manual techniques;Vasopneumatic Device;Taping;Dry needling;Passive range of motion    Consulted and Agree with Plan of Care  Patient       Patient will benefit from skilled therapeutic intervention in order to improve the following deficits and impairments:  Pain, Impaired UE functional use, Decreased strength, Decreased activity tolerance  Visit Diagnosis: Cervicalgia  Abnormal posture  Other symptoms and signs involving the musculoskeletal system     Problem List There are no active problems to display for this patient.   SLanney Gins PT, DPT 03/15/17 3:59 PM  PHYSICAL THERAPY DISCHARGE SUMMARY  Visits from Start of Care: 7  Current functional level related to goals / functional outcomes: See above   Remaining deficits: See above; intermittent pain - education on prevention and  management with good understanding   Education / Equipment: HEP  Plan: Patient agrees to discharge.  Patient goals were partially met. Patient is being discharged due to being pleased with the current functional level.  ?????    SLanney Gins PT, DPT 03/15/17 4:00 PM   [facility]  [facility] [address]  [place], [place], [address] Phone: [phone]   Fax:  [phone]  Name: Shanya Ferriss MRN: [id number] Date of Birth: 04-Jul-1955

## 2017-03-15 NOTE — Patient Instructions (Signed)
Resisted External Rotation: in Neutral - Bilateral - PALMS UP   Sit or stand, tubing in both hands, elbows at sides, bent to 90, forearms forward. Pinch shoulder blades together and rotate forearms out. Keep elbows at sides. Repeat _10-15___ times per set. Do __2__ sets per session.   Resisted Horizontal Abduction: Bilateral   Sit or stand, tubing in both hands, arms out in front. Keeping arms straight, pinch shoulder blades together and stretch arms out. Repeat __10-15__ times per set. Do _2__ sets per session.

## 2017-12-21 ENCOUNTER — Emergency Department (HOSPITAL_BASED_OUTPATIENT_CLINIC_OR_DEPARTMENT_OTHER)
Admission: EM | Admit: 2017-12-21 | Discharge: 2017-12-21 | Disposition: A | Discharge status: Home / Self Care | Payer: Medicare Other | Attending: Emergency Medicine | Admitting: Emergency Medicine

## 2017-12-21 ENCOUNTER — Other Ambulatory Visit: Payer: Self-pay

## 2017-12-21 ENCOUNTER — Encounter (HOSPITAL_BASED_OUTPATIENT_CLINIC_OR_DEPARTMENT_OTHER): Payer: Self-pay | Admitting: *Deleted

## 2017-12-21 DIAGNOSIS — M549 Dorsalgia, unspecified: Secondary | ICD-10-CM | POA: Insufficient documentation

## 2017-12-21 DIAGNOSIS — Z5321 Procedure and treatment not carried out due to patient leaving prior to being seen by health care provider: Secondary | ICD-10-CM | POA: Insufficient documentation

## 2017-12-21 NOTE — ED Triage Notes (Signed)
Chronic back pain for a year. States she needs a pain shot.

## 2018-04-02 ENCOUNTER — Encounter (HOSPITAL_BASED_OUTPATIENT_CLINIC_OR_DEPARTMENT_OTHER): Payer: Self-pay | Admitting: Emergency Medicine

## 2018-04-02 ENCOUNTER — Emergency Department (HOSPITAL_BASED_OUTPATIENT_CLINIC_OR_DEPARTMENT_OTHER)
Admission: EM | Admit: 2018-04-02 | Discharge: 2018-04-02 | Disposition: A | Discharge status: Home / Self Care | Payer: Medicare Other | Attending: Emergency Medicine | Admitting: Emergency Medicine

## 2018-04-02 DIAGNOSIS — K6289 Other specified diseases of anus and rectum: Secondary | ICD-10-CM | POA: Diagnosis present

## 2018-04-02 DIAGNOSIS — Z5321 Procedure and treatment not carried out due to patient leaving prior to being seen by health care provider: Secondary | ICD-10-CM | POA: Insufficient documentation

## 2018-04-02 NOTE — ED Triage Notes (Signed)
Pt states she has 10/10 rectal pain since last night getting worse today, pt denies any rectal bleed, pt using OTC medication with no relief.

## 2018-10-03 ENCOUNTER — Other Ambulatory Visit: Payer: Self-pay

## 2018-10-03 ENCOUNTER — Encounter (HOSPITAL_BASED_OUTPATIENT_CLINIC_OR_DEPARTMENT_OTHER): Payer: Self-pay | Admitting: *Deleted

## 2018-10-03 ENCOUNTER — Emergency Department (HOSPITAL_BASED_OUTPATIENT_CLINIC_OR_DEPARTMENT_OTHER)
Admission: EM | Admit: 2018-10-03 | Discharge: 2018-10-03 | Disposition: A | Discharge status: Home / Self Care | Payer: Medicare Other | Attending: Emergency Medicine | Admitting: Emergency Medicine

## 2018-10-03 DIAGNOSIS — Z79899 Other long term (current) drug therapy: Secondary | ICD-10-CM | POA: Insufficient documentation

## 2018-10-03 DIAGNOSIS — J40 Bronchitis, not specified as acute or chronic: Secondary | ICD-10-CM

## 2018-10-03 DIAGNOSIS — R0789 Other chest pain: Secondary | ICD-10-CM | POA: Diagnosis present

## 2018-10-03 MED ORDER — AZITHROMYCIN 250 MG PO TABS: 250.0000 mg | ORAL_TABLET | Freq: Every day | ORAL | 0 refills | Status: DC

## 2018-10-03 MED FILL — AZITHROMYCIN 250 MG TABS: 250 | 5 days supply | Qty: 6 | Fill #0

## 2018-10-03 NOTE — ED Provider Notes (Signed)
[facility] EMERGENCY DEPARTMENT Provider Note   CSN: [id number] Arrival date & time: 10/03/18  1003     History   Chief Complaint Chief Complaint  Patient presents with  . Chest Pain    HPI Sherry Randall is a 63 y.o. female.     The history is provided by the patient.  URI Presenting symptoms: congestion, cough, rhinorrhea and sore throat   Presenting symptoms: no fever   Severity:  Moderate Onset quality:  Gradual Duration:  5 days Timing:  Constant Progression:  Worsening Chronicity:  New Relieved by:  None tried Exacerbated by: Patient typically runs 6 miles daily and has noticed it is worse if she tries to run. Ineffective treatments:  None tried Associated symptoms: no wheezing   Associated symptoms comment:  Small amount of mucus with coughing and feels slightly short of breath with running but not at rest.  States she has had bronchitis in the past that feels similar to this.  She typically gets allergy symptoms twice a year with season changes.  Patient was tested for COVID on Saturday and it came back negative.  She has still not had any fever.  She denies any smoking, alcohol or drug use. Risk factors: no recent illness, no recent travel and no sick contacts     Past Medical History:  Diagnosis Date  . Back problem   . H pylori ulcer     There are no active problems to display for this patient.   Past Surgical History:  Procedure Laterality Date  . CESAREAN SECTION    . FINGER SURGERY    . HAND SURGERY  august 2016     OB History    Gravida  2   Para  2   Term      Preterm      AB      Living        SAB      TAB      Ectopic      Multiple      Live Births               Home Medications    Prior to Admission medications   Medication Sig Start Date End Date Taking? Authorizing Provider  acetaminophen (TYLENOL) 160 MG/5ML elixir Take 15.6 mLs (500 mg total) by mouth every 4 (four) hours as needed for fever.  12/29/15   Charlesetta Shanks, MD  azithromycin (ZITHROMAX) 250 MG tablet Take 1 tablet (250 mg total) by mouth daily. Take first 2 tablets together, then 1 every day until finished. 10/03/18   Blanchie Dessert, MD  BIOTIN PO Take 1 tablet by mouth daily.    [provider]  cholecalciferol (VITAMIN D) 1000 units tablet Take 1,000 Units by mouth daily.    [provider]  cyclobenzaprine (FLEXERIL) 10 MG tablet Take 1 tablet (10 mg total) by mouth 2 (two) times daily as needed for muscle spasms. 05/10/16   Shary Decamp, PA-C  ibuprofen (ADVIL,MOTRIN) 400 MG tablet Take 1 tablet (400 mg total) by mouth every 6 (six) hours as needed. 05/10/16   Shary Decamp, PA-C  levofloxacin (LEVAQUIN) 250 MG tablet Take 250 mg by mouth daily.    [provider]  methocarbamol (ROBAXIN) 500 MG tablet Take 1 tablet (500 mg total) by mouth 2 (two) times daily. 06/28/16   Lacretia Leigh, MD  Omega-3 Fatty Acids (FISH OIL) 1000 MG CAPS Take 1 capsule by mouth daily.    [provider]  phenazopyridine (PYRIDIUM) 100 MG tablet Take 1 tablet (100 mg total) by mouth 3 (three) times daily as needed for pain. 03/14/17   Horton, Mayer Maskerourtney F, MD    Family History Family History  Problem Relation Age of Onset  . Cancer Mother        pancreatic     Social History Social History   Tobacco Use  . Smoking status: Never Smoker  . Smokeless tobacco: Never Used  Substance Use Topics  . Alcohol use: No  . Drug use: No     Allergies   Augmentin [amoxicillin-pot clavulanate], Avelox [moxifloxacin hcl in nacl], Biaxin [clarithromycin], Hydrocodone, and Keflet [cephalexin]   Review of Systems Review of Systems  Constitutional: Negative for fever.  HENT: Positive for congestion, rhinorrhea and sore throat.   Respiratory: Positive for cough. Negative for wheezing.   All other systems reviewed and are negative.    Physical Exam Updated Vital Signs BP 128/61   Pulse 65   Temp 98.5 F (36.9  C) (Oral)   Resp 18   SpO2 100%   Physical Exam Vitals signs and nursing note reviewed.  Constitutional:      General: She is not in acute distress.    Appearance: She is well-developed and normal weight.  HENT:     Head: Normocephalic and atraumatic.     Right Ear: Tympanic membrane normal.     Left Ear: Tympanic membrane normal.     Nose: Congestion present.     Mouth/Throat:     Mouth: Mucous membranes are moist.     Pharynx: Posterior oropharyngeal erythema present.  Eyes:     Conjunctiva/sclera: Conjunctivae normal.     Pupils: Pupils are equal, round, and reactive to light.  Neck:     Musculoskeletal: Normal range of motion and neck supple.  Cardiovascular:     Rate and Rhythm: Normal rate and regular rhythm.     Heart sounds: No murmur.  Pulmonary:     Effort: Pulmonary effort is normal. No respiratory distress.     Breath sounds: Normal breath sounds. No wheezing or rales.  Abdominal:     General: There is no distension.     Palpations: Abdomen is soft.     Tenderness: There is no abdominal tenderness. There is no guarding or rebound.  Musculoskeletal: Normal range of motion.        General: No tenderness.  Skin:    General: Skin is warm and dry.     Findings: No erythema or rash.  Neurological:     Mental Status: She is alert and oriented to person, place, and time.  Psychiatric:        Mood and Affect: Mood normal.        Behavior: Behavior normal.        Thought Content: Thought content normal.      ED Treatments / Results  Labs (all labs ordered are listed, but only abnormal results are displayed) Labs Reviewed - No data to display  EKG EKG Interpretation  Date/Time:  Wednesday October 03 2018 10:13:20 EDT Ventricular Rate:  67 PR Interval:    QRS Duration: 85 QT Interval:  415 QTC Calculation: 439 R Axis:   67 Text Interpretation:  Sinus rhythm No significant change since last tracing Confirmed by Gwyneth SproutPlunkett, Elam Ellis ([id number]) on 10/03/2018  11:02:17 AM   Radiology No results found.  Procedures Procedures (including critical care time)  Medications Ordered in ED Medications - No data to display  Initial Impression / Assessment and Plan / ED Course  I have reviewed the triage vital signs and the nursing notes.  Pertinent labs & imaging results that were available during my care of the patient were reviewed by me and considered in my medical decision making (see chart for details).        Pt with symptoms consistent with viral URI versus possible bronchitis.  Well appearing here.  No signs of breathing difficulty  No signs of pharyngitis, otitis or abnormal abdominal findings.   Breath sounds are clear and low suspicion for pneumonia at this time.  Patient is in no acute distress satting 100% on room air.  Patient is not having any chest discomfort consistent with ACS.  EKG was done prior to being evaluated which was within normal limits.  Final Clinical Impressions(s) / ED Diagnoses   Final diagnoses:  Bronchitis    ED Discharge Orders         Ordered    azithromycin (ZITHROMAX) 250 MG tablet  Daily     10/03/18 1103           Gwyneth Sprout, MD 10/03/18 1247

## 2018-10-03 NOTE — ED Triage Notes (Signed)
Sore throat last Saturday and Sunday-took some cough drops.  Woke up this morning with tightness to her right chest and coughing.

## 2018-12-20 ENCOUNTER — Encounter (HOSPITAL_BASED_OUTPATIENT_CLINIC_OR_DEPARTMENT_OTHER): Payer: Self-pay | Admitting: Emergency Medicine

## 2018-12-20 ENCOUNTER — Emergency Department (HOSPITAL_BASED_OUTPATIENT_CLINIC_OR_DEPARTMENT_OTHER)
Admission: EM | Admit: 2018-12-20 | Discharge: 2018-12-20 | Disposition: A | Discharge status: Home / Self Care | Payer: Medicare Other | Attending: Emergency Medicine | Admitting: Emergency Medicine

## 2018-12-20 ENCOUNTER — Other Ambulatory Visit: Payer: Self-pay

## 2018-12-20 DIAGNOSIS — R109 Unspecified abdominal pain: Secondary | ICD-10-CM | POA: Diagnosis present

## 2018-12-20 DIAGNOSIS — R112 Nausea with vomiting, unspecified: Secondary | ICD-10-CM

## 2018-12-20 DIAGNOSIS — Z79899 Other long term (current) drug therapy: Secondary | ICD-10-CM | POA: Insufficient documentation

## 2018-12-20 LAB — URINALYSIS, ROUTINE W REFLEX MICROSCOPIC
Bilirubin Urine: NEGATIVE
Glucose, UA: NEGATIVE mg/dL
Ketones, ur: NEGATIVE mg/dL
Leukocytes,Ua: NEGATIVE
Nitrite: NEGATIVE
Protein, ur: NEGATIVE mg/dL
Specific Gravity, Urine: 1.015 (ref 1.005–1.030)
pH: 7 (ref 5.0–8.0)

## 2018-12-20 LAB — COMPREHENSIVE METABOLIC PANEL
ALT: 17 U/L (ref 0–44)
AST: 29 U/L (ref 15–41)
Albumin: 4 g/dL (ref 3.5–5.0)
Alkaline Phosphatase: 86 U/L (ref 38–126)
Anion gap: 9 (ref 5–15)
BUN: 14 mg/dL (ref 8–23)
CO2: 26 mmol/L (ref 22–32)
Calcium: 8.9 mg/dL (ref 8.9–10.3)
Chloride: 106 mmol/L (ref 98–111)
Creatinine, Ser: 0.65 mg/dL (ref 0.44–1.00)
GFR calc Af Amer: >60 mL/min (ref >60)
GFR calc non Af Amer: >60 mL/min (ref >60)
Glucose, Bld: 74 mg/dL (ref 70–99)
Potassium: 3.6 mmol/L (ref 3.5–5.1)
Sodium: 141 mmol/L (ref 135–145)
Total Bilirubin: 0.6 mg/dL (ref 0.3–1.2)
Total Protein: 6.9 g/dL (ref 6.5–8.1)

## 2018-12-20 LAB — URINALYSIS, MICROSCOPIC (REFLEX): Squamous Epithelial / LPF: NONE SEEN (ref 0–5)

## 2018-12-20 LAB — CBC WITH DIFFERENTIAL/PLATELET
Abs Immature Granulocytes: 0 10*3/uL (ref 0.00–0.07)
Basophils Absolute: 0 10*3/uL (ref 0.0–0.1)
Basophils Relative: 1 %
Eosinophils Absolute: 0.3 10*3/uL (ref 0.0–0.5)
Eosinophils Relative: 5 %
HCT: 42.7 % (ref 36.0–46.0)
Hemoglobin: 13.7 g/dL (ref 12.0–15.0)
Immature Granulocytes: 0 %
Lymphocytes Relative: 49 %
Lymphs Abs: 2.7 10*3/uL (ref 0.7–4.0)
MCH: 29 pg (ref 26.0–34.0)
MCHC: 32.1 g/dL (ref 30.0–36.0)
MCV: 90.3 fL (ref 80.0–100.0)
Monocytes Absolute: 0.4 10*3/uL (ref 0.1–1.0)
Monocytes Relative: 7 %
Neutro Abs: 2.1 10*3/uL (ref 1.7–7.7)
Neutrophils Relative %: 38 %
Platelets: 290 10*3/uL (ref 150–400)
RBC: 4.73 MIL/uL (ref 3.87–5.11)
RDW: 13.4 % (ref 11.5–15.5)
WBC: 5.6 10*3/uL (ref 4.0–10.5)
nRBC: 0 % (ref 0.0–0.2)

## 2018-12-20 MED ORDER — ONDANSETRON 4 MG PO TBDP: 4.0000 mg | ORAL_TABLET | Freq: Three times a day (TID) | ORAL | 0 refills | Status: DC | PRN

## 2018-12-20 MED ORDER — ONDANSETRON HCL 4 MG/2ML IJ SOLN
4.0000 mg | Freq: Once | INTRAMUSCULAR | Status: AC
  Administered 2018-12-20: 09:00:00 4 mg via INTRAVENOUS
  Filled 2018-12-20: qty 2

## 2018-12-20 MED ORDER — SODIUM CHLORIDE 0.9 % IV BOLUS
1000.0000 mL | Freq: Once | INTRAVENOUS | Status: AC
  Administered 2018-12-20: 09:00:00 1000 mL via INTRAVENOUS

## 2018-12-20 NOTE — ED Provider Notes (Signed)
[facility] EMERGENCY DEPARTMENT Provider Note   CSN: [id number] Arrival date & time: 12/20/18  0809     History   Chief Complaint Chief Complaint  Patient presents with  . Abdominal Pain    HPI Sherry Randall is a 63 y.o. female.  She is presenting with lower abdominal crampy pain nausea and vomiting.  She thinks it was the dressing that she got at the grocery store.  She said she had a few bites and immediately started feeling some growling in her stomach and some nausea.  This began yesterday.  She said she vomited 3 times very violently.  One episode of foul-smelling stool.  No diarrhea.  No fevers or chills.  No cough shortness of breath.  No urinary symptoms.  No sick contacts or recent travel.     The history is provided by the patient.  Abdominal Pain Pain location: lower. Pain quality: aching, bloating and cramping   Pain radiates to:  Does not radiate Pain severity:  Moderate Onset quality:  Gradual Duration:  24 hours Timing:  Constant Progression:  Unchanged Chronicity:  New Context: eating and suspicious food intake   Context: not trauma   Relieved by:  None tried Worsened by:  Nothing Ineffective treatments:  None tried Associated symptoms: nausea and vomiting   Associated symptoms: no chest pain, no chills, no constipation, no cough, no diarrhea, no dysuria, no fever, no hematemesis, no hematochezia, no hematuria, no melena, no shortness of breath and no sore throat     Past Medical History:  Diagnosis Date  . Back problem   . H pylori ulcer     There are no active problems to display for this patient.   Past Surgical History:  Procedure Laterality Date  . CESAREAN SECTION    . FINGER SURGERY    . HAND SURGERY  august 2016     OB History    Gravida  2   Para  2   Term      Preterm      AB      Living        SAB      TAB      Ectopic      Multiple      Live Births               Home Medications    Prior  to Admission medications   Medication Sig Start Date End Date Taking? Authorizing Provider  acetaminophen (TYLENOL) 160 MG/5ML elixir Take 15.6 mLs (500 mg total) by mouth every 4 (four) hours as needed for fever. 12/29/15   Arby Barrette, MD  azithromycin (ZITHROMAX) 250 MG tablet Take 1 tablet (250 mg total) by mouth daily. Take first 2 tablets together, then 1 every day until finished. 10/03/18   Gwyneth Sprout, MD  BIOTIN PO Take 1 tablet by mouth daily.    [provider]  cholecalciferol (VITAMIN D) 1000 units tablet Take 1,000 Units by mouth daily.    [provider]  cyclobenzaprine (FLEXERIL) 10 MG tablet Take 1 tablet (10 mg total) by mouth 2 (two) times daily as needed for muscle spasms. 05/10/16   Audry Pili, PA-C  ibuprofen (ADVIL,MOTRIN) 400 MG tablet Take 1 tablet (400 mg total) by mouth every 6 (six) hours as needed. 05/10/16   Audry Pili, PA-C  levofloxacin (LEVAQUIN) 250 MG tablet Take 250 mg by mouth daily.    [provider]  methocarbamol (ROBAXIN) 500 MG tablet Take  1 tablet (500 mg total) by mouth 2 (two) times daily. 06/28/16   Lacretia Leigh, MD  Omega-3 Fatty Acids (FISH OIL) 1000 MG CAPS Take 1 capsule by mouth daily.    [provider]  phenazopyridine (PYRIDIUM) 100 MG tablet Take 1 tablet (100 mg total) by mouth 3 (three) times daily as needed for pain. 03/14/17   Horton, Barbette Hair, MD    Family History Family History  Problem Relation Age of Onset  . Cancer Mother        pancreatic     Social History Social History   Tobacco Use  . Smoking status: Never Smoker  . Smokeless tobacco: Never Used  Substance Use Topics  . Alcohol use: No  . Drug use: No     Allergies   Sulfa antibiotics, Augmentin [amoxicillin-pot clavulanate], Avelox [moxifloxacin hcl in nacl], Biaxin [clarithromycin], Hydrocodone, Keflet [cephalexin], and Tetracyclines & related   Review of Systems Review of Systems  Constitutional: Negative for  chills and fever.  HENT: Negative for sore throat.   Eyes: Negative for visual disturbance.  Respiratory: Negative for cough and shortness of breath.   Cardiovascular: Negative for chest pain.  Gastrointestinal: Positive for abdominal pain, nausea and vomiting. Negative for constipation, diarrhea, hematemesis, hematochezia and melena.  Genitourinary: Negative for dysuria and hematuria.  Musculoskeletal: Negative for neck pain.  Skin: Negative for rash.  Neurological: Negative for headaches.     Physical Exam Updated Vital Signs BP 129/81   Pulse 66   Temp 98 F (36.7 C) (Oral)   Resp 16   Ht 5\' 6"  (1.676 m)   Wt 49.9 kg   SpO2 100%   BMI 17.75 kg/m   Physical Exam Vitals signs and nursing note reviewed.  Constitutional:      General: She is not in acute distress.    Appearance: She is well-developed.  HENT:     Head: Normocephalic and atraumatic.  Eyes:     Conjunctiva/sclera: Conjunctivae normal.  Neck:     Musculoskeletal: Neck supple.  Cardiovascular:     Rate and Rhythm: Normal rate and regular rhythm.     Heart sounds: No murmur.  Pulmonary:     Effort: Pulmonary effort is normal. No respiratory distress.     Breath sounds: Normal breath sounds.  Abdominal:     Palpations: Abdomen is soft.     Tenderness: There is no abdominal tenderness. There is no guarding or rebound.  Musculoskeletal: Normal range of motion.     Right lower leg: No edema.     Left lower leg: No edema.  Skin:    General: Skin is warm and dry.     Capillary Refill: Capillary refill takes less than 2 seconds.  Neurological:     General: No focal deficit present.     Mental Status: She is alert.      ED Treatments / Results  Labs (all labs ordered are listed, but only abnormal results are displayed) Labs Reviewed  URINALYSIS, ROUTINE W REFLEX MICROSCOPIC - Abnormal; Notable for the following components:      Result Value   Hgb urine dipstick SMALL (*)    All other components  within normal limits  URINALYSIS, MICROSCOPIC (REFLEX) - Abnormal; Notable for the following components:   Bacteria, UA FEW (*)    All other components within normal limits  COMPREHENSIVE METABOLIC PANEL  CBC WITH DIFFERENTIAL/PLATELET    EKG None  Radiology No results found.  Procedures Procedures (including critical care time)  Medications Ordered in ED Medications  sodium chloride 0.9 % bolus 1,000 mL (has no administration in time range)  ondansetron (ZOFRAN) injection 4 mg (has no administration in time range)     Initial Impression / Assessment and Plan / ED Course  I have reviewed the triage vital signs and the nursing notes.  Pertinent labs & imaging results that were available during my care of the patient were reviewed by me and considered in my medical decision making (see chart for details).  Clinical Course as of Dec 19 1232  Thu Dec 20, 2018  [id number] 63 year old female here with acute onset of nausea vomiting and crampy abdominal pain after eating some food from the grocery store.  Differential includes gastroenteritis, gastritis, obstruction, diverticulitis, colitis, urinary tract infection.   [MB]  [id number] Reviewed lab work-up with patient.  No significant abnormalities.  P.o. challenging now.   [MB]    Clinical Course User Index [MB] Terrilee FilesButler, Prudence Heiny C, MD         Final Clinical Impressions(s) / ED Diagnoses   Final diagnoses:  Non-intractable vomiting with nausea, unspecified vomiting type    ED Discharge Orders         Ordered    ondansetron (ZOFRAN ODT) 4 MG disintegrating tablet  Every 8 hours PRN     12/20/18 1015           Terrilee FilesButler, Aspen Lawrance C, MD 12/20/18 1235

## 2018-12-20 NOTE — Discharge Instructions (Addendum)
You were seen in the emergency department for nausea vomiting abdominal cramping.  Your lab work was unremarkable.  We are sending you home with a prescription for nausea medication.  Please start with a clear liquid diet and advance as tolerated.  Return if any worsening symptoms.

## 2018-12-20 NOTE — ED Triage Notes (Signed)
Pt states she believes she ate something bad yesterday.  Some N/V.  No diarrhea.  Some chills.

## 2019-01-24 ENCOUNTER — Other Ambulatory Visit: Payer: Self-pay

## 2019-01-24 ENCOUNTER — Encounter (HOSPITAL_BASED_OUTPATIENT_CLINIC_OR_DEPARTMENT_OTHER): Payer: Self-pay

## 2019-01-24 ENCOUNTER — Emergency Department (HOSPITAL_BASED_OUTPATIENT_CLINIC_OR_DEPARTMENT_OTHER)
Admission: EM | Admit: 2019-01-24 | Discharge: 2019-01-24 | Disposition: A | Discharge status: Home / Self Care | Payer: Medicare Other | Attending: Emergency Medicine | Admitting: Emergency Medicine

## 2019-01-24 ENCOUNTER — Emergency Department (HOSPITAL_BASED_OUTPATIENT_CLINIC_OR_DEPARTMENT_OTHER): Payer: Medicare Other

## 2019-01-24 DIAGNOSIS — Y9301 Activity, walking, marching and hiking: Secondary | ICD-10-CM | POA: Diagnosis not present

## 2019-01-24 DIAGNOSIS — S8002XA Contusion of left knee, initial encounter: Secondary | ICD-10-CM | POA: Diagnosis not present

## 2019-01-24 DIAGNOSIS — Y999 Unspecified external cause status: Secondary | ICD-10-CM | POA: Diagnosis not present

## 2019-01-24 DIAGNOSIS — S8992XA Unspecified injury of left lower leg, initial encounter: Secondary | ICD-10-CM | POA: Diagnosis present

## 2019-01-24 DIAGNOSIS — Y92018 Other place in single-family (private) house as the place of occurrence of the external cause: Secondary | ICD-10-CM | POA: Diagnosis not present

## 2019-01-24 DIAGNOSIS — W108XXA Fall (on) (from) other stairs and steps, initial encounter: Secondary | ICD-10-CM | POA: Insufficient documentation

## 2019-01-24 MED ORDER — ACETAMINOPHEN 325 MG PO TABS
650.0000 mg | ORAL_TABLET | Freq: Once | ORAL | Status: AC
  Administered 2019-01-24: 650 mg via ORAL

## 2019-01-24 MED ORDER — ACETAMINOPHEN 325 MG PO TABS
ORAL_TABLET | ORAL | Status: AC
  Filled 2019-01-24: qty 2

## 2019-01-24 NOTE — ED Provider Notes (Signed)
[facility] Provider Note: Georgena Spurling, MD, FACEP  CSN: [id number] MRN: [id number] ARRIVAL: 01/24/19 at 2008 ROOM: [place]  Knee Injury   HISTORY OF PRESENT ILLNESS  01/24/19 11:17 PM Sherry Randall is a 63 y.o. female who was walking down some steps at her son's house and missed a step injuring her left knee.  This occurred about 8 PM.  She is having pain in her left knee which is negligible at rest but becomes moderate when weightbearing.  There is no associated deformity or swelling.  There is some ecchymosis.  She states her toes are numb distally.  She denies injury elsewhere.  She has taken acetaminophen with improvement.   Past Medical History:  Diagnosis Date  . Back problem   . H pylori ulcer     Past Surgical History:  Procedure Laterality Date  . CESAREAN SECTION    . FINGER SURGERY    . HAND SURGERY  august 2016    Family History  Problem Relation Age of Onset  . Cancer Mother        pancreatic     Social History   Tobacco Use  . Smoking status: Never Smoker  . Smokeless tobacco: Never Used  Substance Use Topics  . Alcohol use: No  . Drug use: No    Prior to Admission medications   Medication Sig Start Date End Date Taking? Authorizing Provider  BIOTIN PO Take 1 tablet by mouth daily.   Yes [provider]  bismuth-metronidazole-tetracycline (PYLERA) [phone] MG capsule Take 1 capsule by mouth 2 (two) times daily.   Yes [provider]  cholecalciferol (VITAMIN D) 1000 units tablet Take 1,000 Units by mouth daily.   Yes [provider]  Omega-3 Fatty Acids (FISH OIL) 1000 MG CAPS Take 1 capsule by mouth daily.   Yes [provider]    Allergies Sulfa antibiotics, Augmentin [amoxicillin-pot clavulanate], Avelox [moxifloxacin hcl in nacl], Biaxin [clarithromycin], Hydrocodone, Keflet [cephalexin], and Tetracyclines & related   REVIEW OF SYSTEMS  Negative except as noted  here or in the History of Present Illness.   PHYSICAL EXAMINATION  Initial Vital Signs Blood pressure 133/70, pulse 63, temperature 98.5 F (36.9 C), temperature source Oral, resp. rate 20, height 5\' 6"  (1.676 m), weight 49.9 kg, SpO2 98 %.  Examination General: Well-developed, well-nourished female in no acute distress; appearance consistent with age of record HENT: normocephalic; atraumatic Eyes: pupils equal, round and reactive to light; extraocular muscles intact Neck: supple Heart: regular rate and rhythm Lungs: clear to auscultation bilaterally Abdomen: soft; nondistended; nontender; bowel sounds present Extremities: No deformity; normal pulses; left knee stable without tenderness, small area of ecchymosis overlying fibular head, brisk capillary refill in toes distally, movement of toes and ankle intact Neurologic: Awake, alert and oriented; motor function intact in all extremities and symmetric; decreased sensation in toes of left foot no facial droop Skin: Warm and dry Psychiatric: Normal mood and affect   RESULTS  Summary of this visit's results, reviewed and interpreted by myself:   EKG Interpretation  Date/Time:    Ventricular Rate:    PR Interval:    QRS Duration:   QT Interval:    QTC Calculation:   R Axis:     Text Interpretation:        Laboratory Studies: No results found for this or any previous visit (from the past 24 hour(s)). Imaging Studies: DG Knee Complete 4 Views Left  Result Date: 01/24/2019 CLINICAL  DATA:  Lt knee pain s/p Fall x tonight coming down steps landing on concrete floor. Pt has lateral side knee pain and surrounding. No old injury known. EXAM: LEFT KNEE - COMPLETE 4+ VIEW COMPARISON:  None. FINDINGS: No evidence of fracture, dislocation, or joint effusion. Mild tricompartmental degenerative changes. No focal bony lesion. Soft tissues are unremarkable. IMPRESSION: No acute osseous abnormality in the left knee. Electronically Signed   By:  Emmaline Kluver M.D.   On: 01/24/2019 20:57    ED COURSE and MDM  Nursing notes, initial and subsequent vitals signs, including pulse oximetry, reviewed and interpreted by myself.  Vitals:   01/24/19 2024 01/24/19 2303  BP: (!) 155/77 133/70  Pulse: 82 63  Resp: 20 20  Temp: 98.5 F (36.9 C)   TempSrc: Oral   SpO2: 99% 98%  Weight: 49.9 kg   Height: 5\' 6"  (1.676 m)    Medications  acetaminophen (TYLENOL) tablet 650 mg (650 mg Oral Given 01/24/19 2211)   There is no laxity or reproducible pain on physical exam.  We will place her in a knee sleeve.  She does not wish to take anything stronger than Tylenol for the pain.   PROCEDURES  Procedures   ED DIAGNOSES     ICD-10-CM   1. Injury of left knee, initial encounter  S89.92XA        Denise Bramblett, 2212, MD 01/24/19 [phone]

## 2019-01-24 NOTE — ED Triage Notes (Signed)
Pt states she missed a step-and injured left knee-NAD-to triage in w/c

## 2019-05-26 ENCOUNTER — Other Ambulatory Visit: Payer: Self-pay

## 2019-05-26 ENCOUNTER — Emergency Department (HOSPITAL_BASED_OUTPATIENT_CLINIC_OR_DEPARTMENT_OTHER): Payer: Medicare HMO

## 2019-05-26 ENCOUNTER — Emergency Department (HOSPITAL_BASED_OUTPATIENT_CLINIC_OR_DEPARTMENT_OTHER)
Admission: EM | Admit: 2019-05-26 | Discharge: 2019-05-26 | Disposition: A | Discharge status: Home / Self Care | Payer: Medicare HMO | Attending: Emergency Medicine | Admitting: Emergency Medicine

## 2019-05-26 ENCOUNTER — Encounter (HOSPITAL_BASED_OUTPATIENT_CLINIC_OR_DEPARTMENT_OTHER): Payer: Self-pay | Admitting: *Deleted

## 2019-05-26 DIAGNOSIS — R531 Weakness: Secondary | ICD-10-CM | POA: Insufficient documentation

## 2019-05-26 DIAGNOSIS — R110 Nausea: Secondary | ICD-10-CM | POA: Insufficient documentation

## 2019-05-26 DIAGNOSIS — R350 Frequency of micturition: Secondary | ICD-10-CM | POA: Diagnosis present

## 2019-05-26 DIAGNOSIS — R5383 Other fatigue: Secondary | ICD-10-CM | POA: Insufficient documentation

## 2019-05-26 DIAGNOSIS — R1032 Left lower quadrant pain: Secondary | ICD-10-CM | POA: Diagnosis not present

## 2019-05-26 DIAGNOSIS — Z79899 Other long term (current) drug therapy: Secondary | ICD-10-CM | POA: Diagnosis not present

## 2019-05-26 HISTORY — DX: Calculus of kidney: N20.0

## 2019-05-26 LAB — BASIC METABOLIC PANEL
Anion gap: 8 (ref 5–15)
BUN: 17 mg/dL (ref 8–23)
CO2: 26 mmol/L (ref 22–32)
Calcium: 8.9 mg/dL (ref 8.9–10.3)
Chloride: 103 mmol/L (ref 98–111)
Creatinine, Ser: 0.7 mg/dL (ref 0.44–1.00)
GFR calc Af Amer: >60 mL/min (ref >60)
GFR calc non Af Amer: >60 mL/min (ref >60)
Glucose, Bld: 103 mg/dL — ABNORMAL HIGH (ref 70–99)
Potassium: 4.3 mmol/L (ref 3.5–5.1)
Sodium: 137 mmol/L (ref 135–145)

## 2019-05-26 LAB — URINALYSIS, ROUTINE W REFLEX MICROSCOPIC
Bilirubin Urine: NEGATIVE
Glucose, UA: NEGATIVE mg/dL
Ketones, ur: NEGATIVE mg/dL
Leukocytes,Ua: NEGATIVE
Nitrite: NEGATIVE
Protein, ur: NEGATIVE mg/dL
Specific Gravity, Urine: 1.015 (ref 1.005–1.030)
pH: 6 (ref 5.0–8.0)

## 2019-05-26 LAB — CBC WITH DIFFERENTIAL/PLATELET
Abs Immature Granulocytes: 0.01 10*3/uL (ref 0.00–0.07)
Basophils Absolute: 0 10*3/uL (ref 0.0–0.1)
Basophils Relative: 1 %
Eosinophils Absolute: 0.4 10*3/uL (ref 0.0–0.5)
Eosinophils Relative: 6 %
HCT: 40.1 % (ref 36.0–46.0)
Hemoglobin: 13.4 g/dL (ref 12.0–15.0)
Immature Granulocytes: 0 %
Lymphocytes Relative: 42 %
Lymphs Abs: 2.5 10*3/uL (ref 0.7–4.0)
MCH: 29.6 pg (ref 26.0–34.0)
MCHC: 33.4 g/dL (ref 30.0–36.0)
MCV: 88.7 fL (ref 80.0–100.0)
Monocytes Absolute: 0.6 10*3/uL (ref 0.1–1.0)
Monocytes Relative: 9 %
Neutro Abs: 2.6 10*3/uL (ref 1.7–7.7)
Neutrophils Relative %: 42 %
Platelets: 252 10*3/uL (ref 150–400)
RBC: 4.52 MIL/uL (ref 3.87–5.11)
RDW: 13.5 % (ref 11.5–15.5)
WBC: 6 10*3/uL (ref 4.0–10.5)
nRBC: 0 % (ref 0.0–0.2)

## 2019-05-26 LAB — URINALYSIS, MICROSCOPIC (REFLEX): Squamous Epithelial / HPF: NONE SEEN (ref 0–5)

## 2019-05-26 NOTE — Discharge Instructions (Addendum)
Drink plenty of fluids and get plenty of rest.  Follow-up with your primary doctor in the next few days if symptoms are not improving.

## 2019-05-26 NOTE — ED Provider Notes (Signed)
[facility] EMERGENCY DEPARTMENT Provider Note   CSN: [id number] Arrival date & time: 05/26/19  1950     History Chief Complaint  Patient presents with  . Urinary Frequency    Sherry Randall is a 64 y.o. female.  Patient is a 64 year old female with history of prior renal calculi.  She presents today with complaints of left flank pain, nausea, and weakness.  She reports feeling nauseated and having no energy.  She denies blood in her urine.  She has been on Cipro for the past week.  She denies fevers or chills, cough, chest pain, or difficulty breathing.  The history is provided by the patient.       Past Medical History:  Diagnosis Date  . Back problem   . H pylori ulcer   . Kidney stone     There are no problems to display for this patient.   Past Surgical History:  Procedure Laterality Date  . CESAREAN SECTION    . FINGER SURGERY    . HAND SURGERY  august 2016  . LITHOTRIPSY       OB History    Gravida  2   Para  2   Term      Preterm      AB      Living        SAB      TAB      Ectopic      Multiple      Live Births              Family History  Problem Relation Age of Onset  . Cancer Mother        pancreatic     Social History   Tobacco Use  . Smoking status: Never Smoker  . Smokeless tobacco: Never Used  Substance Use Topics  . Alcohol use: No  . Drug use: No    Home Medications Prior to Admission medications   Medication Sig Start Date End Date Taking? Authorizing Provider  BIOTIN PO Take 1 tablet by mouth daily.    [provider]  bismuth-metronidazole-tetracycline [facility]) [phone] MG capsule Take 1 capsule by mouth 2 (two) times daily.    [provider]  cholecalciferol (VITAMIN D) 1000 units tablet Take 1,000 Units by mouth daily.    [provider]  Omega-3 Fatty Acids (FISH OIL) 1000 MG CAPS Take 1 capsule by mouth daily.    [provider]    Allergies      Sulfa antibiotics, Augmentin [amoxicillin-pot clavulanate], Avelox [moxifloxacin hcl in nacl], Biaxin [clarithromycin], Hydrocodone, Keflet [cephalexin], and Tetracyclines & related  Review of Systems   Review of Systems  All other systems reviewed and are negative.   Physical Exam Updated Vital Signs BP 124/70 (BP Location: Left Arm)   Pulse 67   Temp 98.1 F (36.7 C) (Oral)   Resp 16   Ht 5\' 6"  (1.676 m)   Wt 51.3 kg   SpO2 100%   BMI 18.24 kg/m   Physical Exam Vitals and nursing note reviewed.  Constitutional:      General: She is not in acute distress.    Appearance: She is well-developed. She is not diaphoretic.  HENT:     Head: Normocephalic and atraumatic.     Mouth/Throat:     Mouth: Mucous membranes are moist.     Pharynx: No oropharyngeal exudate or posterior oropharyngeal erythema.  Cardiovascular:     Rate and Rhythm: Normal rate and  regular rhythm.     Heart sounds: No murmur. No friction rub. No gallop.   Pulmonary:     Effort: Pulmonary effort is normal. No respiratory distress.     Breath sounds: Normal breath sounds. No wheezing.  Abdominal:     General: Bowel sounds are normal. There is no distension.     Palpations: Abdomen is soft.     Tenderness: There is no abdominal tenderness.  Musculoskeletal:        General: No swelling or tenderness. Normal range of motion.     Cervical back: Normal range of motion and neck supple.     Right lower leg: No edema.     Left lower leg: No edema.  Skin:    General: Skin is warm and dry.  Neurological:     Mental Status: She is alert and oriented to person, place, and time.     ED Results / Procedures / Treatments   Labs (all labs ordered are listed, but only abnormal results are displayed) Labs Reviewed  URINALYSIS, ROUTINE W REFLEX MICROSCOPIC  BASIC METABOLIC PANEL  CBC WITH DIFFERENTIAL/PLATELET    EKG EKG Interpretation  Date/Time:  Sunday May 26 2019 21:04:02 EDT Ventricular Rate:  63 PR  Interval:    QRS Duration: 92 QT Interval:  400 QTC Calculation: 410 R Axis:   81 Text Interpretation: Sinus rhythm Borderline right axis deviation No signficant change from prior study Confirmed by Veryl Speak [phone]) on 05/26/2019 9:41:47 PM   Radiology No results found.  Procedures Procedures (including critical care time)  Medications Ordered in ED Medications - No data to display  ED Course  I have reviewed the triage vital signs and the nursing notes.  Pertinent labs & imaging results that were available during my care of the patient were reviewed by me and considered in my medical decision making (see chart for details).    MDM Rules/Calculators/A&P  Patient presenting here with complaints of fatigue and urinary frequency.  She has a history of kidney stones and is concerned she may have one now.  Her urinalysis is unremarkable and laboratory studies are also unremarkable.  I am uncertain as to why she is experiencing the symptoms, however nothing today appears emergent.  Her laboratory studies are unremarkable, EKG is unchanged, and vital signs are stable.  She is to be discharged with as needed follow-up with her primary doctor.  Final Clinical Impression(s) / ED Diagnoses Final diagnoses:  None    Rx / DC Orders ED Discharge Orders    None       Veryl Speak, MD 05/26/19 2142

## 2019-05-26 NOTE — ED Triage Notes (Signed)
Pt reports 2 weeks of urinary frequency. States she has hx of "impacted kidney stones". C/o left flank pain, worse this week. +nausea

## 2019-06-22 ENCOUNTER — Other Ambulatory Visit: Payer: Self-pay

## 2019-06-22 ENCOUNTER — Encounter (HOSPITAL_BASED_OUTPATIENT_CLINIC_OR_DEPARTMENT_OTHER): Payer: Self-pay | Admitting: Emergency Medicine

## 2019-06-22 DIAGNOSIS — M25562 Pain in left knee: Secondary | ICD-10-CM | POA: Diagnosis present

## 2019-06-22 DIAGNOSIS — M542 Cervicalgia: Secondary | ICD-10-CM | POA: Insufficient documentation

## 2019-06-22 DIAGNOSIS — Z5321 Procedure and treatment not carried out due to patient leaving prior to being seen by health care provider: Secondary | ICD-10-CM | POA: Diagnosis not present

## 2019-06-22 DIAGNOSIS — M546 Pain in thoracic spine: Secondary | ICD-10-CM | POA: Diagnosis not present

## 2019-06-22 DIAGNOSIS — R519 Headache, unspecified: Secondary | ICD-10-CM | POA: Diagnosis not present

## 2019-06-22 NOTE — ED Triage Notes (Signed)
Pt states she fell at [employer] around 1500 today. C/o pain to L knee, R wrist, posterior neck, posterior head and upper back pain. Pt is ambulatory. C/o nausea.

## 2019-06-22 NOTE — ED Notes (Signed)
Pt states "I want you to put in my chart that the only thing that helps my stomach is Phenergan. That other stuff they give me (Zofran) just hypes me up and makes me not feel good. Suppositories work nice if they're willing to give me one."

## 2019-06-23 ENCOUNTER — Emergency Department (HOSPITAL_BASED_OUTPATIENT_CLINIC_OR_DEPARTMENT_OTHER)
Admission: EM | Admit: 2019-06-23 | Discharge: 2019-06-24 | Disposition: A | Discharge status: Home / Self Care | Payer: Medicare HMO | Source: Home / Self Care | Attending: Emergency Medicine | Admitting: Emergency Medicine

## 2019-06-23 ENCOUNTER — Encounter (HOSPITAL_BASED_OUTPATIENT_CLINIC_OR_DEPARTMENT_OTHER): Payer: Self-pay | Admitting: *Deleted

## 2019-06-23 ENCOUNTER — Emergency Department (HOSPITAL_BASED_OUTPATIENT_CLINIC_OR_DEPARTMENT_OTHER): Payer: Medicare HMO

## 2019-06-23 ENCOUNTER — Emergency Department (HOSPITAL_BASED_OUTPATIENT_CLINIC_OR_DEPARTMENT_OTHER)
Admission: EM | Admit: 2019-06-23 | Discharge: 2019-06-23 | Disposition: A | Discharge status: Home / Self Care | Payer: Medicare HMO | Attending: Emergency Medicine | Admitting: Emergency Medicine

## 2019-06-23 DIAGNOSIS — M791 Myalgia, unspecified site: Secondary | ICD-10-CM

## 2019-06-23 DIAGNOSIS — S8002XA Contusion of left knee, initial encounter: Secondary | ICD-10-CM

## 2019-06-23 DIAGNOSIS — W19XXXA Unspecified fall, initial encounter: Secondary | ICD-10-CM

## 2019-06-23 MED ORDER — KETOROLAC TROMETHAMINE 15 MG/ML IJ SOLN
15.0000 mg | Freq: Once | INTRAMUSCULAR | Status: AC
  Administered 2019-06-24: 15 mg via INTRAMUSCULAR
  Filled 2019-06-23: qty 1

## 2019-06-23 NOTE — ED Triage Notes (Signed)
Pt states that she fell at [employer] at 1500 yesterday. Reports left knee pain, right wrist pain, mid back pain, posterior right sided neck pain and HA since that time. Denies hitting head. Ambulatory.

## 2019-06-23 NOTE — ED Notes (Signed)
Pt informed registration staff that she was uncomfortable and needed to go home.

## 2019-06-24 MED ORDER — CYCLOBENZAPRINE HCL 5 MG PO TABS: 5.0000 mg | ORAL_TABLET | Freq: Two times a day (BID) | ORAL | 0 refills | Status: AC | PRN

## 2019-06-24 MED ORDER — NAPROXEN 500 MG PO TABS: 500.0000 mg | ORAL_TABLET | Freq: Two times a day (BID) | ORAL | 0 refills | Status: AC

## 2019-06-24 NOTE — Discharge Instructions (Addendum)
You were seen today after a fall.  Your x-rays do not show any bony injury or fracture.  You are likely experiencing musculoskeletal pain from forcefully twisting.  Take a short course of naproxen.  Limit this use for 3 to 5 days.  Also you will be given a short course of Flexeril.  You should not drive while taking Flexeril.  Apply ice to the contusion to your left knee.

## 2019-06-24 NOTE — ED Provider Notes (Signed)
[facility] EMERGENCY DEPARTMENT Provider Note   CSN: [id number] Arrival date & time: 06/23/19  2256     History Chief Complaint  Patient presents with  . Fall    Sherry Randall is a 64 y.o. female.  HPI     This is a 64 year old female who presents following a fall.  Patient reports that she was in [employer] approximately 36 hours ago when she slipped and fell on the floor.  She reports that her legs split.  She is unsure how she fell.  However, she reports left knee pain right shoulder and neck pain and headache.  She denies hitting her head or loss of consciousness.  She is not on any blood thinners.  She has taken Tylenol with some relief of pain.  Currently rates her pain 8 out of 10.  Denies any chest pain, shortness of breath, abdominal pain.  Reports nausea without vomiting.  She states that she feels much more sore than she did when she first fell.  Past Medical History:  Diagnosis Date  . Back problem   . H pylori ulcer   . Kidney stone     There are no problems to display for this patient.   Past Surgical History:  Procedure Laterality Date  . CESAREAN SECTION    . FINGER SURGERY    . HAND SURGERY  august 2016  . LITHOTRIPSY       OB History    Gravida  2   Para  2   Term      Preterm      AB      Living        SAB      TAB      Ectopic      Multiple      Live Births              Family History  Problem Relation Age of Onset  . Cancer Mother        pancreatic     Social History   Tobacco Use  . Smoking status: Never Smoker  . Smokeless tobacco: Never Used  Substance Use Topics  . Alcohol use: No  . Drug use: No    Home Medications Prior to Admission medications   Medication Sig Start Date End Date Taking? Authorizing Provider  BIOTIN PO Take 1 tablet by mouth daily.    [provider]  bismuth-metronidazole-tetracycline [facility]) [phone] MG capsule Take 1 capsule by mouth 2 (two) times  daily.    [provider]  cholecalciferol (VITAMIN D) 1000 units tablet Take 1,000 Units by mouth daily.    [provider]  cyclobenzaprine (FLEXERIL) 5 MG tablet Take 1 tablet (5 mg total) by mouth 2 (two) times daily as needed for muscle spasms. 06/24/19   Platon Arocho, Mayer Masker, MD  naproxen (NAPROSYN) 500 MG tablet Take 1 tablet (500 mg total) by mouth 2 (two) times daily. 06/24/19   Montia Haslip, Mayer Masker, MD  Omega-3 Fatty Acids (FISH OIL) 1000 MG CAPS Take 1 capsule by mouth daily.    [provider]    Allergies    Sulfa antibiotics, Augmentin [amoxicillin-pot clavulanate], Avelox [moxifloxacin hcl in nacl], Biaxin [clarithromycin], Hydrocodone, Keflet [cephalexin], and Tetracyclines & related  Review of Systems   Review of Systems  Constitutional: Negative for fever.  Respiratory: Negative for shortness of breath.   Cardiovascular: Negative for chest pain.  Gastrointestinal: Positive for nausea. Negative for abdominal pain.  Musculoskeletal:  Negative for back pain.       Left knee pain, right shoulder neck pain  Skin: Positive for color change.  Neurological: Positive for headaches.  All other systems reviewed and are negative.   Physical Exam Updated Vital Signs BP 129/79 (BP Location: Right Arm)   Pulse 63   Temp 98.1 F (36.7 C) (Oral)   Resp 18   Ht 1.676 m (5\' 6" )   Wt 50.8 kg   SpO2 100%   BMI 18.08 kg/m   Physical Exam Vitals and nursing note reviewed.  Constitutional:      Appearance: She is well-developed. She is not ill-appearing.     Comments: ABCs intact  HENT:     Head: Normocephalic and atraumatic.     Nose: Nose normal.     Mouth/Throat:     Mouth: Mucous membranes are moist.  Eyes:     Pupils: Pupils are equal, round, and reactive to light.  Neck:     Comments: No midline C-spine tenderness palpation, step-off, or deformity Tenderness to palpation right upper trapezius muscle Cardiovascular:     Rate and Rhythm: Normal  rate and regular rhythm.     Heart sounds: Normal heart sounds.  Pulmonary:     Effort: Pulmonary effort is normal. No respiratory distress.     Breath sounds: No wheezing.     Comments: Tenderness palpation posterior right chest wall, no crepitus Chest:     Chest wall: Tenderness present.  Abdominal:     General: Bowel sounds are normal.     Palpations: Abdomen is soft.     Tenderness: There is no abdominal tenderness.  Musculoskeletal:     Cervical back: Neck supple.     Comments: Tenderness palpation left knee with overlying contusion just inferior to the patella, quadriceps tendon intact, no significant swelling, no obvious deformities  Skin:    General: Skin is warm and dry.  Neurological:     Mental Status: She is alert and oriented to person, place, and time.  Psychiatric:        Mood and Affect: Mood normal.     ED Results / Procedures / Treatments   Labs (all labs ordered are listed, but only abnormal results are displayed) Labs Reviewed - No data to display  EKG None  Radiology DG Chest 2 View  Result Date: 06/24/2019 CLINICAL DATA:  Fall EXAM: CHEST - 2 VIEW COMPARISON:  None. FINDINGS: The heart size and mediastinal contours are within normal limits. The upper lung zones are mildly hyperinflated. No large airspace consolidation or pleural effusion. The visualized skeletal structures are unremarkable. IMPRESSION: No active cardiopulmonary disease. Electronically Signed   By: 06/26/2019 M.D.   On: 06/24/2019 00:01   DG Knee Complete 4 Views Left  Result Date: 06/24/2019 CLINICAL DATA:  Fall EXAM: LEFT KNEE - COMPLETE 4+ VIEW COMPARISON:  None. FINDINGS: No evidence of fracture, dislocation, or joint effusion. There is diffuse osteopenia. 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 Mild prepatellar subcutaneous edema is noted. IMPRESSION: No acute osseous abnormality. Electronically Signed   By: 06/26/2019  M.D.   On: 06/24/2019 00:05    Procedures Procedures (including critical care time)  Medications Ordered in ED Medications  ketorolac (TORADOL) 15 MG/ML injection 15 mg (15 mg Intramuscular Given 06/24/19 0001)    ED Course  I have reviewed the triage vital signs and the nursing notes.  Pertinent labs & imaging results that were available during  my care of the patient were reviewed by me and considered in my medical decision making (see chart for details).    MDM Rules/Calculators/A&P                       Patient presents following a fall.  She is overall nontoxic.  She is greater than 1 day out from injury.  She has a contusion to her left knee and some musculoskeletal tenderness over the right trapezius and posterior chest.  Patient was given Toradol.  Chart reviewed and she has normal renal function.  Chest x-ray shows no evidence of pneumothorax, pneumonia, rib fracture.  This was independently reviewed by myself.  Additionally knee films are without evidence of acute fracture.  Suspect contusion and musculoskeletal pain.  Will discharge with a short course of naproxen and Flexeril.  Patient was advised not to take Flexeril and drive.  No indications for CT imaging of the head at this time as she did not hit her head or lose consciousness and she is not on blood thinners.  After history, exam, and medical workup I feel the patient has been appropriately medically screened and is safe for discharge home. Pertinent diagnoses were discussed with the patient. Patient was given return precautions.   Final Clinical Impression(s) / ED Diagnoses Final diagnoses:  Fall, initial encounter  Contusion of left knee, initial encounter  Muscle pain    Rx / DC Orders ED Discharge Orders         Ordered    naproxen (NAPROSYN) 500 MG tablet  2 times daily     06/24/19 0013    cyclobenzaprine (FLEXERIL) 5 MG tablet  2 times daily PRN     06/24/19 0013           Merryl Hacker,  MD 06/24/19 [phone]

## 2019-07-18 ENCOUNTER — Other Ambulatory Visit: Payer: Self-pay

## 2019-07-18 ENCOUNTER — Emergency Department (HOSPITAL_BASED_OUTPATIENT_CLINIC_OR_DEPARTMENT_OTHER)
Admission: EM | Admit: 2019-07-18 | Discharge: 2019-07-19 | Discharge status: Against Medical Advice | Payer: Medicare HMO | Attending: Emergency Medicine | Admitting: Emergency Medicine

## 2019-07-18 ENCOUNTER — Encounter (HOSPITAL_BASED_OUTPATIENT_CLINIC_OR_DEPARTMENT_OTHER): Payer: Self-pay | Admitting: *Deleted

## 2019-07-18 DIAGNOSIS — Y92512 Supermarket, store or market as the place of occurrence of the external cause: Secondary | ICD-10-CM | POA: Insufficient documentation

## 2019-07-18 DIAGNOSIS — S3992XA Unspecified injury of lower back, initial encounter: Secondary | ICD-10-CM | POA: Diagnosis not present

## 2019-07-18 DIAGNOSIS — Y998 Other external cause status: Secondary | ICD-10-CM | POA: Insufficient documentation

## 2019-07-18 DIAGNOSIS — S80912A Unspecified superficial injury of left knee, initial encounter: Secondary | ICD-10-CM | POA: Diagnosis not present

## 2019-07-18 DIAGNOSIS — Z5321 Procedure and treatment not carried out due to patient leaving prior to being seen by health care provider: Secondary | ICD-10-CM | POA: Diagnosis not present

## 2019-07-18 DIAGNOSIS — W19XXXA Unspecified fall, initial encounter: Secondary | ICD-10-CM | POA: Insufficient documentation

## 2019-07-18 DIAGNOSIS — S6992XA Unspecified injury of left wrist, hand and finger(s), initial encounter: Secondary | ICD-10-CM | POA: Insufficient documentation

## 2019-07-18 DIAGNOSIS — Y9389 Activity, other specified: Secondary | ICD-10-CM | POA: Insufficient documentation

## 2019-07-18 DIAGNOSIS — S299XXA Unspecified injury of thorax, initial encounter: Secondary | ICD-10-CM | POA: Insufficient documentation

## 2019-07-18 NOTE — ED Triage Notes (Signed)
States she fell at [employer] today. Injury to her chest, back, right wrist, and left knee. She is ambulatory with no difficulty.

## 2019-09-29 ENCOUNTER — Encounter (HOSPITAL_BASED_OUTPATIENT_CLINIC_OR_DEPARTMENT_OTHER): Payer: Self-pay | Admitting: Emergency Medicine

## 2019-09-29 ENCOUNTER — Emergency Department (HOSPITAL_BASED_OUTPATIENT_CLINIC_OR_DEPARTMENT_OTHER)
Admission: EM | Admit: 2019-09-29 | Discharge: 2019-09-29 | Disposition: A | Discharge status: Home / Self Care | Payer: Medicare HMO | Attending: Emergency Medicine | Admitting: Emergency Medicine

## 2019-09-29 ENCOUNTER — Other Ambulatory Visit: Payer: Self-pay

## 2019-09-29 DIAGNOSIS — R102 Pelvic and perineal pain: Secondary | ICD-10-CM | POA: Diagnosis not present

## 2019-09-29 DIAGNOSIS — R319 Hematuria, unspecified: Secondary | ICD-10-CM | POA: Diagnosis not present

## 2019-09-29 DIAGNOSIS — Z5321 Procedure and treatment not carried out due to patient leaving prior to being seen by health care provider: Secondary | ICD-10-CM | POA: Insufficient documentation

## 2019-09-29 NOTE — ED Triage Notes (Signed)
Pt reports that she has blood in urine and was supposed to get a CT scan on Friday.  Pt c/o vaginal pain.

## 2020-03-06 ENCOUNTER — Other Ambulatory Visit: Payer: Self-pay

## 2020-03-06 ENCOUNTER — Encounter (HOSPITAL_BASED_OUTPATIENT_CLINIC_OR_DEPARTMENT_OTHER): Payer: Self-pay | Admitting: *Deleted

## 2020-03-06 DIAGNOSIS — H53149 Visual discomfort, unspecified: Secondary | ICD-10-CM | POA: Diagnosis not present

## 2020-03-06 DIAGNOSIS — R519 Headache, unspecified: Secondary | ICD-10-CM | POA: Insufficient documentation

## 2020-03-06 DIAGNOSIS — R110 Nausea: Secondary | ICD-10-CM | POA: Insufficient documentation

## 2020-03-06 NOTE — ED Triage Notes (Addendum)
C/o h/a x 8 months , pt refused to take surgical gloves off during triage , for VS , instructed pt that we needed to get a pulse ox reading. Pt  said her gloves were clean after she touched her mask and triage chair

## 2020-03-07 ENCOUNTER — Emergency Department (HOSPITAL_BASED_OUTPATIENT_CLINIC_OR_DEPARTMENT_OTHER)
Admission: EM | Admit: 2020-03-07 | Discharge: 2020-03-07 | Disposition: A | Discharge status: Home / Self Care | Payer: Medicare HMO | Attending: Emergency Medicine | Admitting: Emergency Medicine

## 2020-03-07 ENCOUNTER — Encounter (HOSPITAL_BASED_OUTPATIENT_CLINIC_OR_DEPARTMENT_OTHER): Payer: Self-pay

## 2020-03-07 ENCOUNTER — Emergency Department (HOSPITAL_BASED_OUTPATIENT_CLINIC_OR_DEPARTMENT_OTHER): Payer: Medicare HMO

## 2020-03-07 DIAGNOSIS — R519 Headache, unspecified: Secondary | ICD-10-CM

## 2020-03-07 LAB — CBC WITH DIFFERENTIAL/PLATELET
Abs Immature Granulocytes: 0 10*3/uL (ref 0.00–0.07)
Basophils Absolute: 0 10*3/uL (ref 0.0–0.1)
Basophils Relative: 0 %
Eosinophils Absolute: 0.3 10*3/uL (ref 0.0–0.5)
Eosinophils Relative: 6 %
HCT: 38.7 % (ref 36.0–46.0)
Hemoglobin: 13 g/dL (ref 12.0–15.0)
Immature Granulocytes: 0 %
Lymphocytes Relative: 46 %
Lymphs Abs: 2.4 10*3/uL (ref 0.7–4.0)
MCH: 29.8 pg (ref 26.0–34.0)
MCHC: 33.6 g/dL (ref 30.0–36.0)
MCV: 88.8 fL (ref 80.0–100.0)
Monocytes Absolute: 0.5 10*3/uL (ref 0.1–1.0)
Monocytes Relative: 9 %
Neutro Abs: 2 10*3/uL (ref 1.7–7.7)
Neutrophils Relative %: 39 %
Platelets: 236 10*3/uL (ref 150–400)
RBC: 4.36 MIL/uL (ref 3.87–5.11)
RDW: 13 % (ref 11.5–15.5)
WBC: 5.3 10*3/uL (ref 4.0–10.5)
nRBC: 0 % (ref 0.0–0.2)

## 2020-03-07 LAB — BASIC METABOLIC PANEL
Anion gap: 8 (ref 5–15)
BUN: 11 mg/dL (ref 8–23)
CO2: 26 mmol/L (ref 22–32)
Calcium: 8.6 mg/dL — ABNORMAL LOW (ref 8.9–10.3)
Chloride: 103 mmol/L (ref 98–111)
Creatinine, Ser: 0.65 mg/dL (ref 0.44–1.00)
GFR, Estimated: >60 mL/min (ref >60)
Glucose, Bld: 96 mg/dL (ref 70–99)
Potassium: 3.9 mmol/L (ref 3.5–5.1)
Sodium: 137 mmol/L (ref 135–145)

## 2020-03-07 MED ORDER — DIPHENHYDRAMINE HCL 50 MG/ML IJ SOLN
25.0000 mg | Freq: Once | INTRAMUSCULAR | Status: AC
  Administered 2020-03-07: 25 mg via INTRAVENOUS
  Filled 2020-03-07: qty 1

## 2020-03-07 MED ORDER — ONDANSETRON 4 MG PO TBDP
4.0000 mg | ORAL_TABLET | Freq: Three times a day (TID) | ORAL | 0 refills | Status: DC | PRN
Start: 2020-03-07 — End: 2020-03-07

## 2020-03-07 MED ORDER — KETOROLAC TROMETHAMINE 30 MG/ML IJ SOLN
15.0000 mg | Freq: Once | INTRAMUSCULAR | Status: DC
  Filled 2020-03-07: qty 1

## 2020-03-07 MED ORDER — IOHEXOL 350 MG/ML SOLN
100.0000 mL | Freq: Once | INTRAVENOUS | Status: AC | PRN
  Administered 2020-03-07: 100 mL via INTRAVENOUS

## 2020-03-07 MED ORDER — ONDANSETRON 4 MG PO TBDP
4.0000 mg | ORAL_TABLET | Freq: Three times a day (TID) | ORAL | 0 refills | Status: AC | PRN
Start: 2020-03-07 — End: ?

## 2020-03-07 MED ORDER — METOCLOPRAMIDE HCL 5 MG/ML IJ SOLN
10.0000 mg | Freq: Once | INTRAMUSCULAR | Status: AC
  Administered 2020-03-07: 10 mg via INTRAVENOUS
  Filled 2020-03-07: qty 2
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
## 2020-03-07 NOTE — ED Notes (Signed)
Assumed care of patient. Vitals taken. A&Ox4. NAD. Connected to BP and pulse ox. Stretcher low, wheels locked, call bell within reach. Will continue to monitor.

## 2020-03-07 NOTE — ED Notes (Signed)
Patient given water and ginger ale for fluid challenge. Pt denies nausea, or abd pain. No further concerns at this time.

## 2020-03-07 NOTE — Discharge Instructions (Signed)
Your CT head scan today is normal.  Follow-up with your neurologist.  Return to the ED if you develop any change in the character of her headache, fever, vomiting, visual change, unilateral weakness, numbness, tingling, difficulty speaking or difficulty swallowing or any other concerns

## 2020-03-08 ENCOUNTER — Telehealth (HOSPITAL_BASED_OUTPATIENT_CLINIC_OR_DEPARTMENT_OTHER): Payer: Self-pay | Admitting: Emergency Medicine

## 2020-03-16 ENCOUNTER — Telehealth: Payer: Self-pay | Admitting: Family Medicine
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
## 2020-03-17 NOTE — Telephone Encounter (Signed)
Left message for patient that since her symptoms are on-going for more than 4-6 weeks that she would better be served by seeing neurology. Provided her with  Neurology's phone number.

## 2020-06-26 ENCOUNTER — Other Ambulatory Visit: Payer: Self-pay

## 2020-06-26 ENCOUNTER — Encounter (HOSPITAL_BASED_OUTPATIENT_CLINIC_OR_DEPARTMENT_OTHER): Payer: Self-pay | Admitting: *Deleted

## 2020-06-26 ENCOUNTER — Emergency Department (HOSPITAL_BASED_OUTPATIENT_CLINIC_OR_DEPARTMENT_OTHER)
Admission: EM | Admit: 2020-06-26 | Discharge: 2020-06-26 | Disposition: A | Discharge status: Home / Self Care | Payer: Medicare HMO | Attending: Emergency Medicine | Admitting: Emergency Medicine

## 2020-06-26 DIAGNOSIS — R519 Headache, unspecified: Secondary | ICD-10-CM | POA: Diagnosis not present

## 2020-06-26 MED ORDER — DIPHENHYDRAMINE HCL 50 MG/ML IJ SOLN
25.0000 mg | Freq: Once | INTRAMUSCULAR | Status: AC
  Administered 2020-06-26: 25 mg via INTRAVENOUS
  Filled 2020-06-26: qty 1

## 2020-06-26 MED ORDER — METOCLOPRAMIDE HCL 5 MG/ML IJ SOLN
10.0000 mg | Freq: Once | INTRAMUSCULAR | Status: AC
  Administered 2020-06-26: 10 mg via INTRAVENOUS
  Filled 2020-06-26: qty 2

## 2020-06-26 MED ORDER — SODIUM CHLORIDE 0.9 % IV BOLUS
250.0000 mL | Freq: Once | INTRAVENOUS | Status: AC
  Administered 2020-06-26: 250 mL via INTRAVENOUS

## 2020-06-26 MED ORDER — PROMETHAZINE HCL 25 MG PO TABS: 25.0000 mg | ORAL_TABLET | Freq: Four times a day (QID) | ORAL | 0 refills | Status: AC | PRN

## 2020-06-26 NOTE — ED Triage Notes (Signed)
C/o h/a x 1 day 

## 2020-06-26 NOTE — Discharge Instructions (Addendum)
Recommend 1000 mg of Tylenol every 6 hours as needed for headache.  Take Phenergan as prescribed.  If you change your mind about getting IV headache cocktail as discussed please return but make sure you have someone who will be able to drive you home.

## 2020-06-26 NOTE — ED Notes (Signed)
Provided patient her D/C paperwork. States she has a ride now and wants headache medication. EDP made aware.

## 2020-06-26 NOTE — ED Provider Notes (Addendum)
[facility] EMERGENCY DEPARTMENT Provider Note   CSN: [id number] Arrival date & time: 06/26/20  1951     History Chief Complaint  Patient presents with  . Headache    Sherry Randall is a 65 y.o. female.  Patient with history of headaches since concussion from last year.  Headache today.  Not much relief with over-the-counter medications.  Mostly having nausea symptoms.  The history is provided by the patient.  Migraine This is a recurrent problem. The current episode started 12 to 24 hours ago. The problem occurs constantly. The problem has not changed since onset.Associated symptoms include headaches. Pertinent negatives include no chest pain, no abdominal pain and no shortness of breath. Nothing aggravates the symptoms. Nothing relieves the symptoms. She has tried nothing for the symptoms. The treatment provided no relief.       Past Medical History:  Diagnosis Date  . Back problem   . H pylori ulcer   . Kidney stone     There are no problems to display for this patient.   Past Surgical History:  Procedure Laterality Date  . CESAREAN SECTION    . FINGER SURGERY    . HAND SURGERY  august 2016  . LITHOTRIPSY       OB History    Gravida  2   Para  2   Term      Preterm      AB      Living        SAB      IAB      Ectopic      Multiple      Live Births              Family History  Problem Relation Age of Onset  . Cancer Mother        pancreatic     Social History   Tobacco Use  . Smoking status: Never Smoker  . Smokeless tobacco: Never Used  Vaping Use  . Vaping Use: Never used  Substance Use Topics  . Alcohol use: No  . Drug use: No    Home Medications Prior to Admission medications   Medication Sig Start Date End Date Taking? Authorizing Provider  promethazine (PHENERGAN) 25 MG tablet Take 1 tablet (25 mg total) by mouth every 6 (six) hours as needed for up to 20 doses for nausea or vomiting. 06/26/20  Yes Charlyne Robertshaw,  Harrie Cazarez, DO  BIOTIN PO Take 1 tablet by mouth daily.    [provider]  bismuth-metronidazole-tetracycline [facility]) [phone] MG capsule Take 1 capsule by mouth 2 (two) times daily.    [provider]  cholecalciferol (VITAMIN D) 1000 units tablet Take 1,000 Units by mouth daily.    [provider]  cyclobenzaprine (FLEXERIL) 5 MG tablet Take 1 tablet (5 mg total) by mouth 2 (two) times daily as needed for muscle spasms. 06/24/19   Horton, Mayer Masker, MD  naproxen (NAPROSYN) 500 MG tablet Take 1 tablet (500 mg total) by mouth 2 (two) times daily. 06/24/19   Horton, Mayer Masker, MD  Omega-3 Fatty Acids (FISH OIL) 1000 MG CAPS Take 1 capsule by mouth daily.    [provider]  ondansetron (ZOFRAN ODT) 4 MG disintegrating tablet Take 1 tablet (4 mg total) by mouth every 8 (eight) hours as needed for nausea or vomiting. 03/07/20   Rancour, Jeannett Senior, MD    Allergies    Sulfa antibiotics, Augmentin [amoxicillin-pot clavulanate], Avelox [moxifloxacin hcl in nacl], Biaxin [clarithromycin],  Hydrocodone, Keflet [cephalexin], and Tetracyclines & related  Review of Systems   Review of Systems  Constitutional: Negative for chills and fever.  HENT: Negative for ear pain and sore throat.   Eyes: Negative for pain and visual disturbance.  Respiratory: Negative for cough and shortness of breath.   Cardiovascular: Negative for chest pain and palpitations.  Gastrointestinal: Negative for abdominal pain and vomiting.  Genitourinary: Negative for dysuria and hematuria.  Musculoskeletal: Negative for arthralgias and back pain.  Skin: Negative for color change and rash.  Neurological: Positive for headaches. Negative for dizziness, tremors, seizures, syncope, facial asymmetry, speech difficulty, weakness, light-headedness and numbness.  All other systems reviewed and are negative.   Physical Exam Updated Vital Signs BP 136/68 (BP Location: Right Arm)   Pulse 72   Temp 98.3 F  (36.8 C) (Oral)   Resp 16   Ht 5\' 6"  (1.676 m)   Wt 49.9 kg   SpO2 100%   BMI 17.75 kg/m   Physical Exam Vitals and nursing note reviewed.  Constitutional:      General: She is not in acute distress.    Appearance: She is well-developed. She is not ill-appearing.  HENT:     Head: Normocephalic and atraumatic.  Eyes:     Extraocular Movements: Extraocular movements intact.     Conjunctiva/sclera: Conjunctivae normal.     Pupils: Pupils are equal, round, and reactive to light.  Cardiovascular:     Rate and Rhythm: Normal rate and regular rhythm.     Heart sounds: Normal heart sounds. No murmur heard.   Pulmonary:     Effort: Pulmonary effort is normal. No respiratory distress.     Breath sounds: Normal breath sounds.  Abdominal:     Palpations: Abdomen is soft.     Tenderness: There is no abdominal tenderness.  Musculoskeletal:     Cervical back: Normal range of motion and neck supple.  Skin:    General: Skin is warm and dry.  Neurological:     Mental Status: She is alert and oriented to person, place, and time.     Cranial Nerves: No cranial nerve deficit.     Sensory: No sensory deficit.     Motor: No weakness.     Coordination: Coordination normal.     Gait: Gait normal.     Comments: 5+ out of 5 strength, normal sensation  Psychiatric:        Mood and Affect: Mood normal.     ED Results / Procedures / Treatments   Labs (all labs ordered are listed, but only abnormal results are displayed) Labs Reviewed - No data to display  EKG None  Radiology No results found.  Procedures Procedures   Medications Ordered in ED Medications - No data to display  ED Course  I have reviewed the triage vital signs and the nursing notes.  Pertinent labs & imaging results that were available during my care of the patient were reviewed by me and considered in my medical decision making (see chart for details).    MDM Rules/Calculators/A&P                           Sherry Randall is here with headache.  Normal vitals.  No fever.  Slightly worse headache than normal for her when she gets breakthrough migraine headaches.  She has had bad migraine since concussion in car accident last year.  Has had some nausea which is typical of  her.  No stroke symptoms.  No new trauma.  Neurologically she is intact.  Patient drove here and had a long discussion about different options for medications here.  She does not have anybody could pick her up.  Overall shared decision was made to prescribe her Phenergan to take at home.  She will return if does not help much we will make sure she has a ride home to get headache cocktail here.  Overall I think she will do fine as overall she appears well and have no concern for meningitis or intracranial process.  Discharged in good condition.  Understands return precautions.  Prior to discharge patient changed her mind.  Was given IM Reglan and Benadryl and fluids with improvement.  Discharged.  This chart was dictated using voice recognition software.  Despite best efforts to proofread,  errors can occur which can change the documentation meaning.   Final Clinical Impression(s) / ED Diagnoses Final diagnoses:  Bad headache    Rx / DC Orders ED Discharge Orders         Ordered    promethazine (PHENERGAN) 25 MG tablet  Every 6 hours PRN        06/26/20 2148           Virgina Norfolk, DO 06/26/20 2151    Virgina Norfolk, DO 06/26/20 2155

## 2020-11-03 IMAGING — DX DG KNEE COMPLETE 4+V*L*
4 series · 4 of 4 positions shown · non-contrast
Comparison: None.

CLINICAL DATA: Fall

EXAM:
LEFT KNEE - COMPLETE 4+ VIEW

[knee ap]
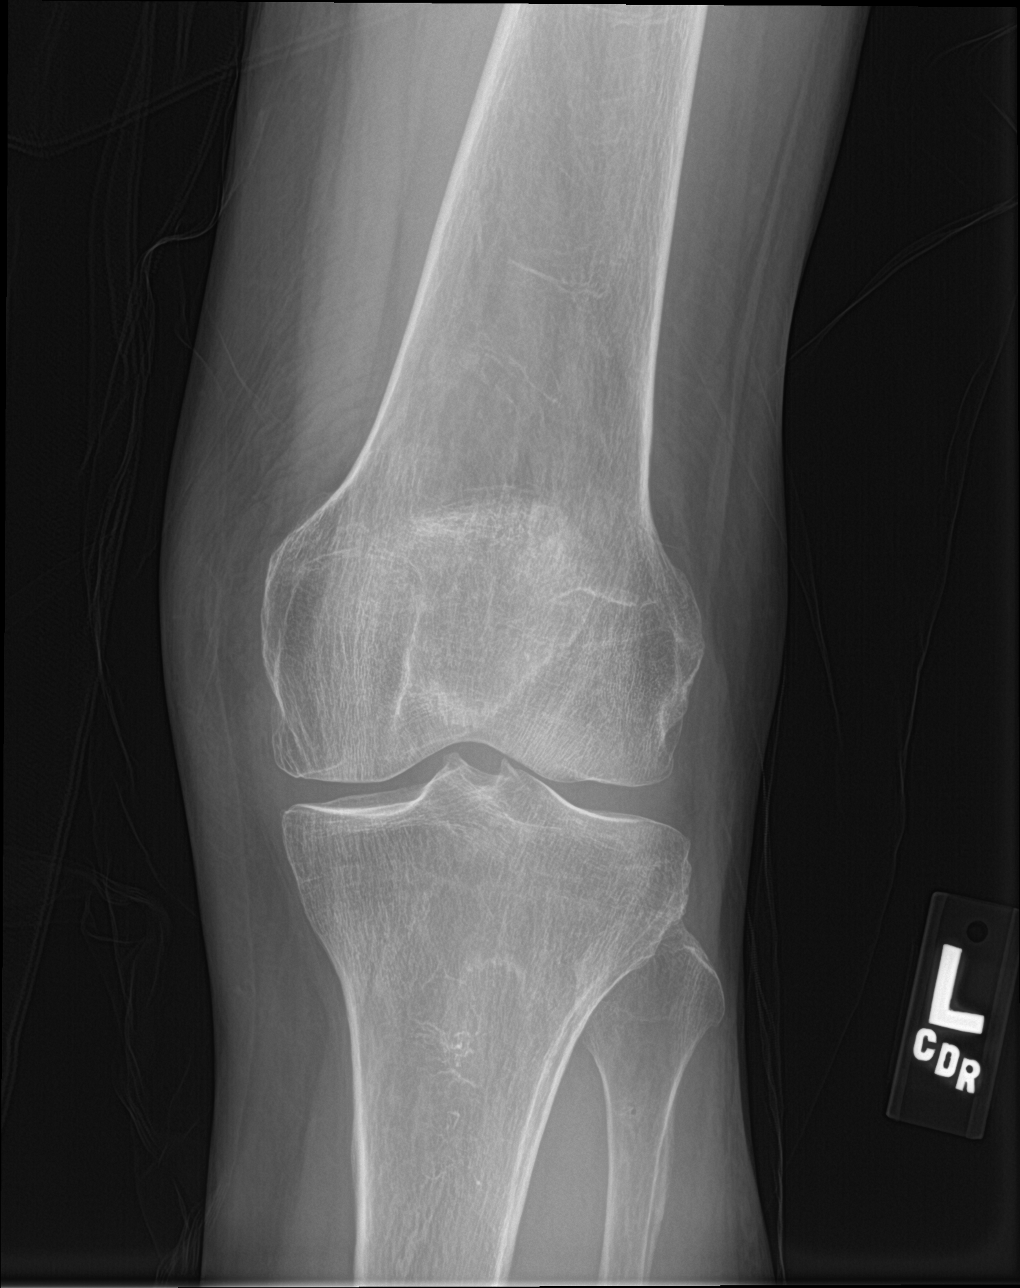

[knee lat]
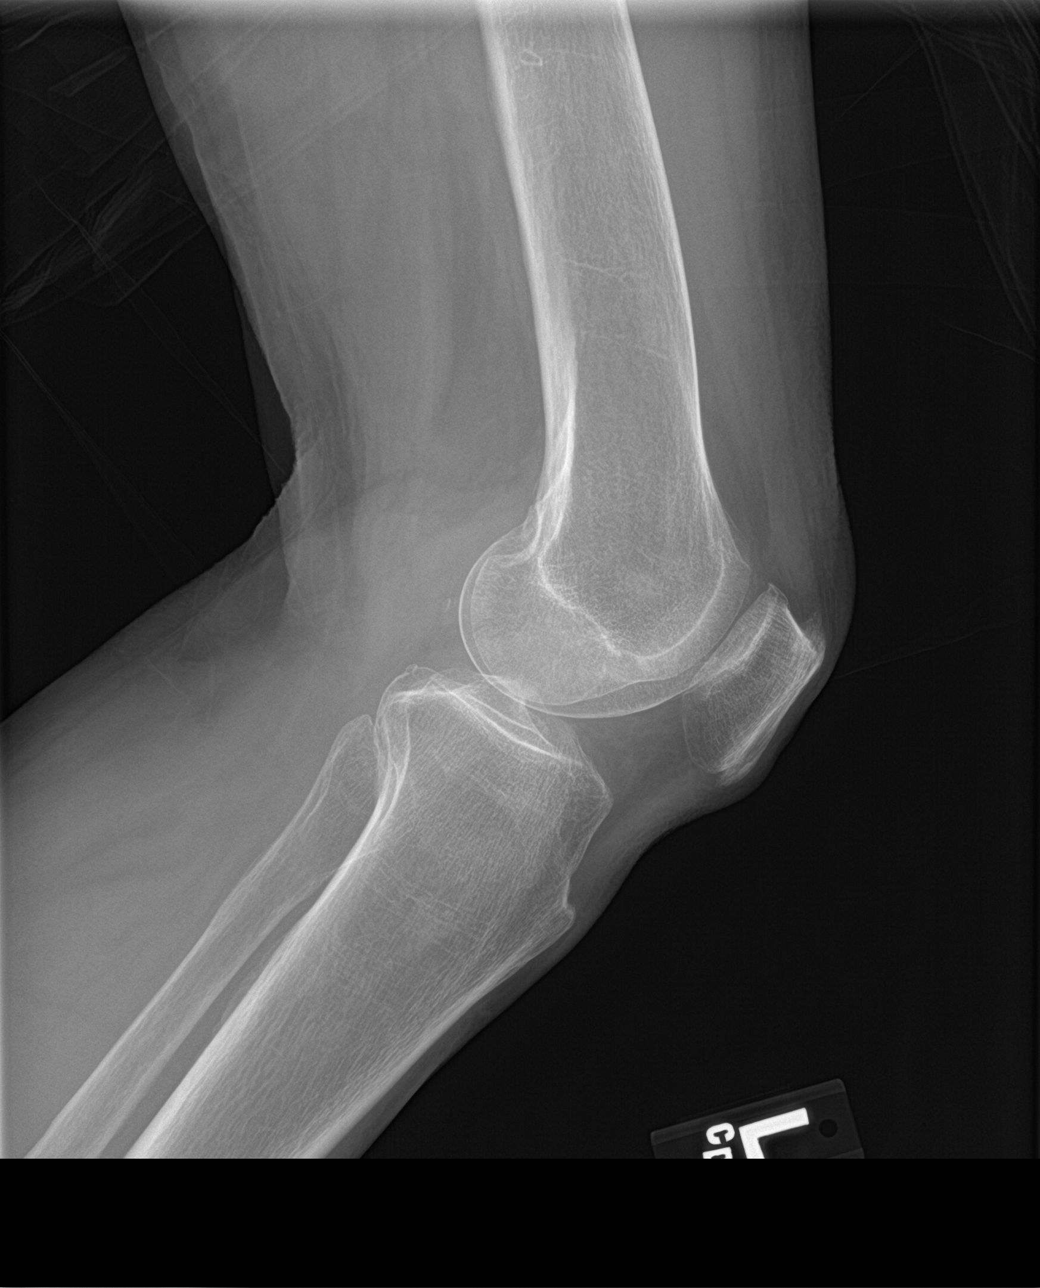

[knee obl (1 of 2)]
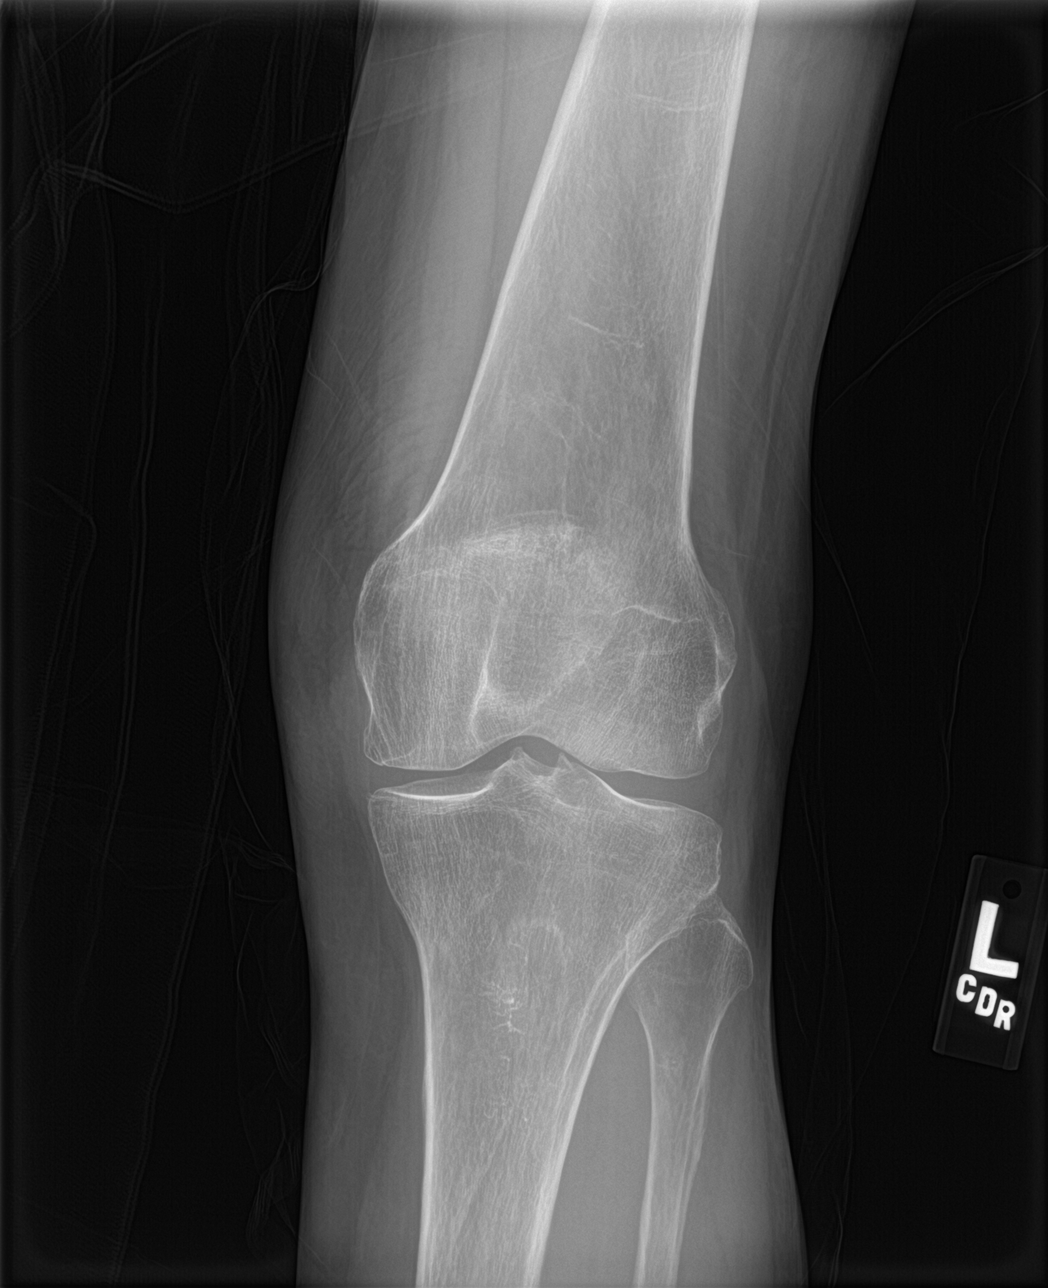

[knee obl (2 of 2)]
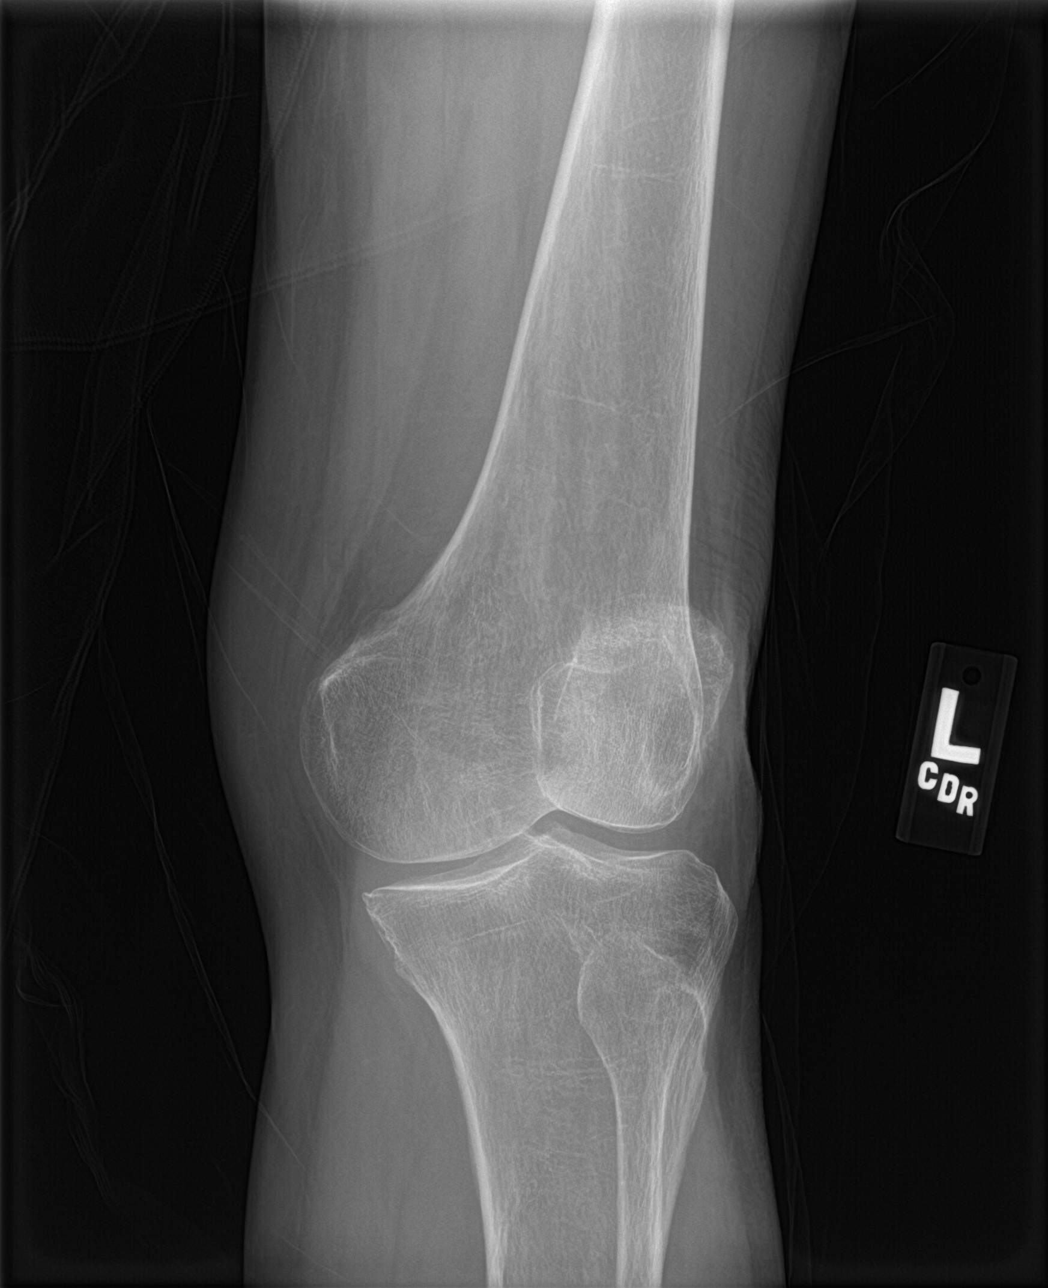

[4 of 4 positions shown; findings below may reference images not displayed]

FINDINGS: No evidence of fracture, dislocation, or joint effusion. There is
diffuse osteopenia. Medial compartment osteoarthritis is seen with
superior joint space loss and subchondral sclerosis. Mild
prepatellar subcutaneous edema is noted.
IMPRESSION: No acute osseous abnormality.

## 2020-11-03 IMAGING — DX DG CHEST 2V
2 series · 2 of 2 positions shown · non-contrast
Comparison: None.

CLINICAL DATA: Fall

EXAM:
CHEST - 2 VIEW

[chest pa]
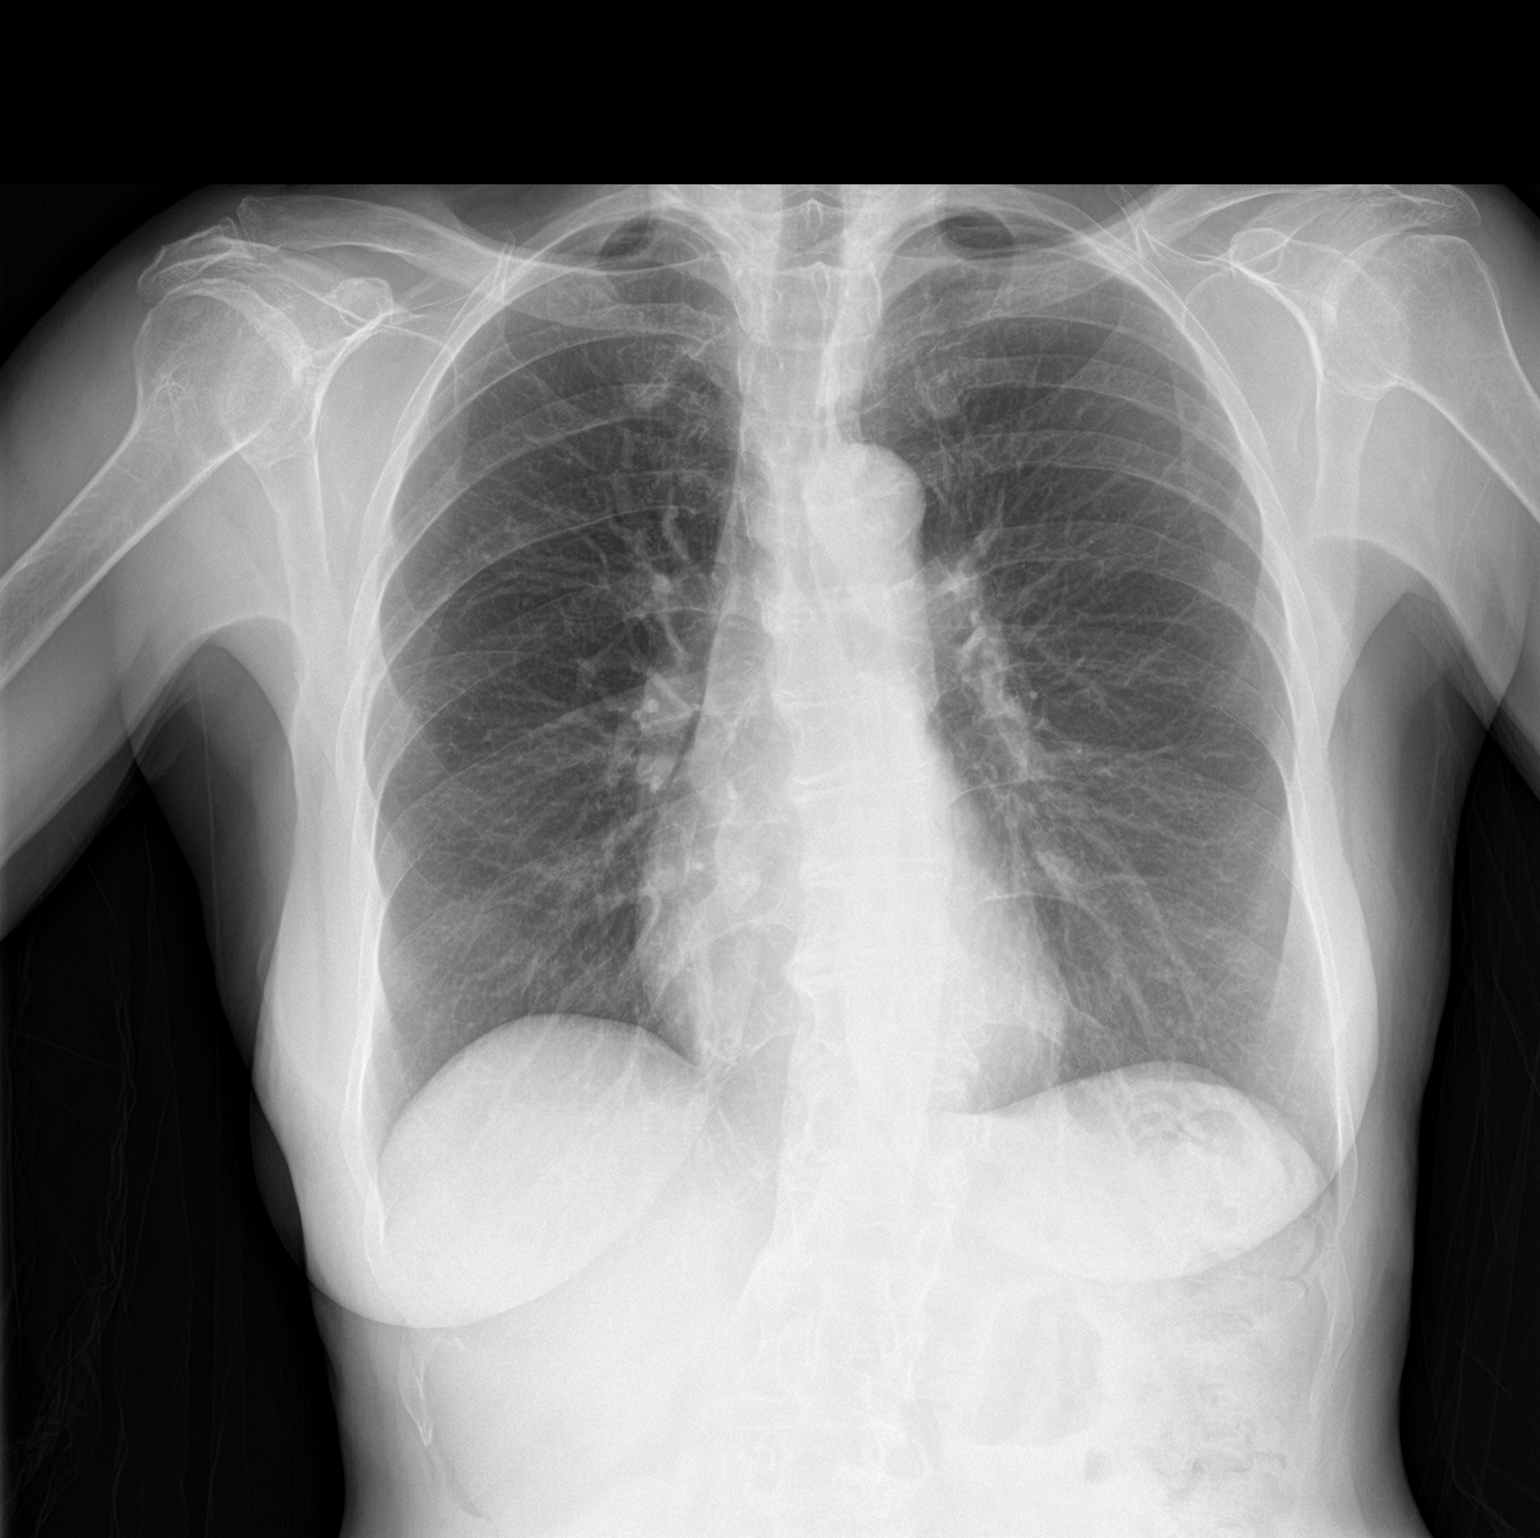

[chest lat]
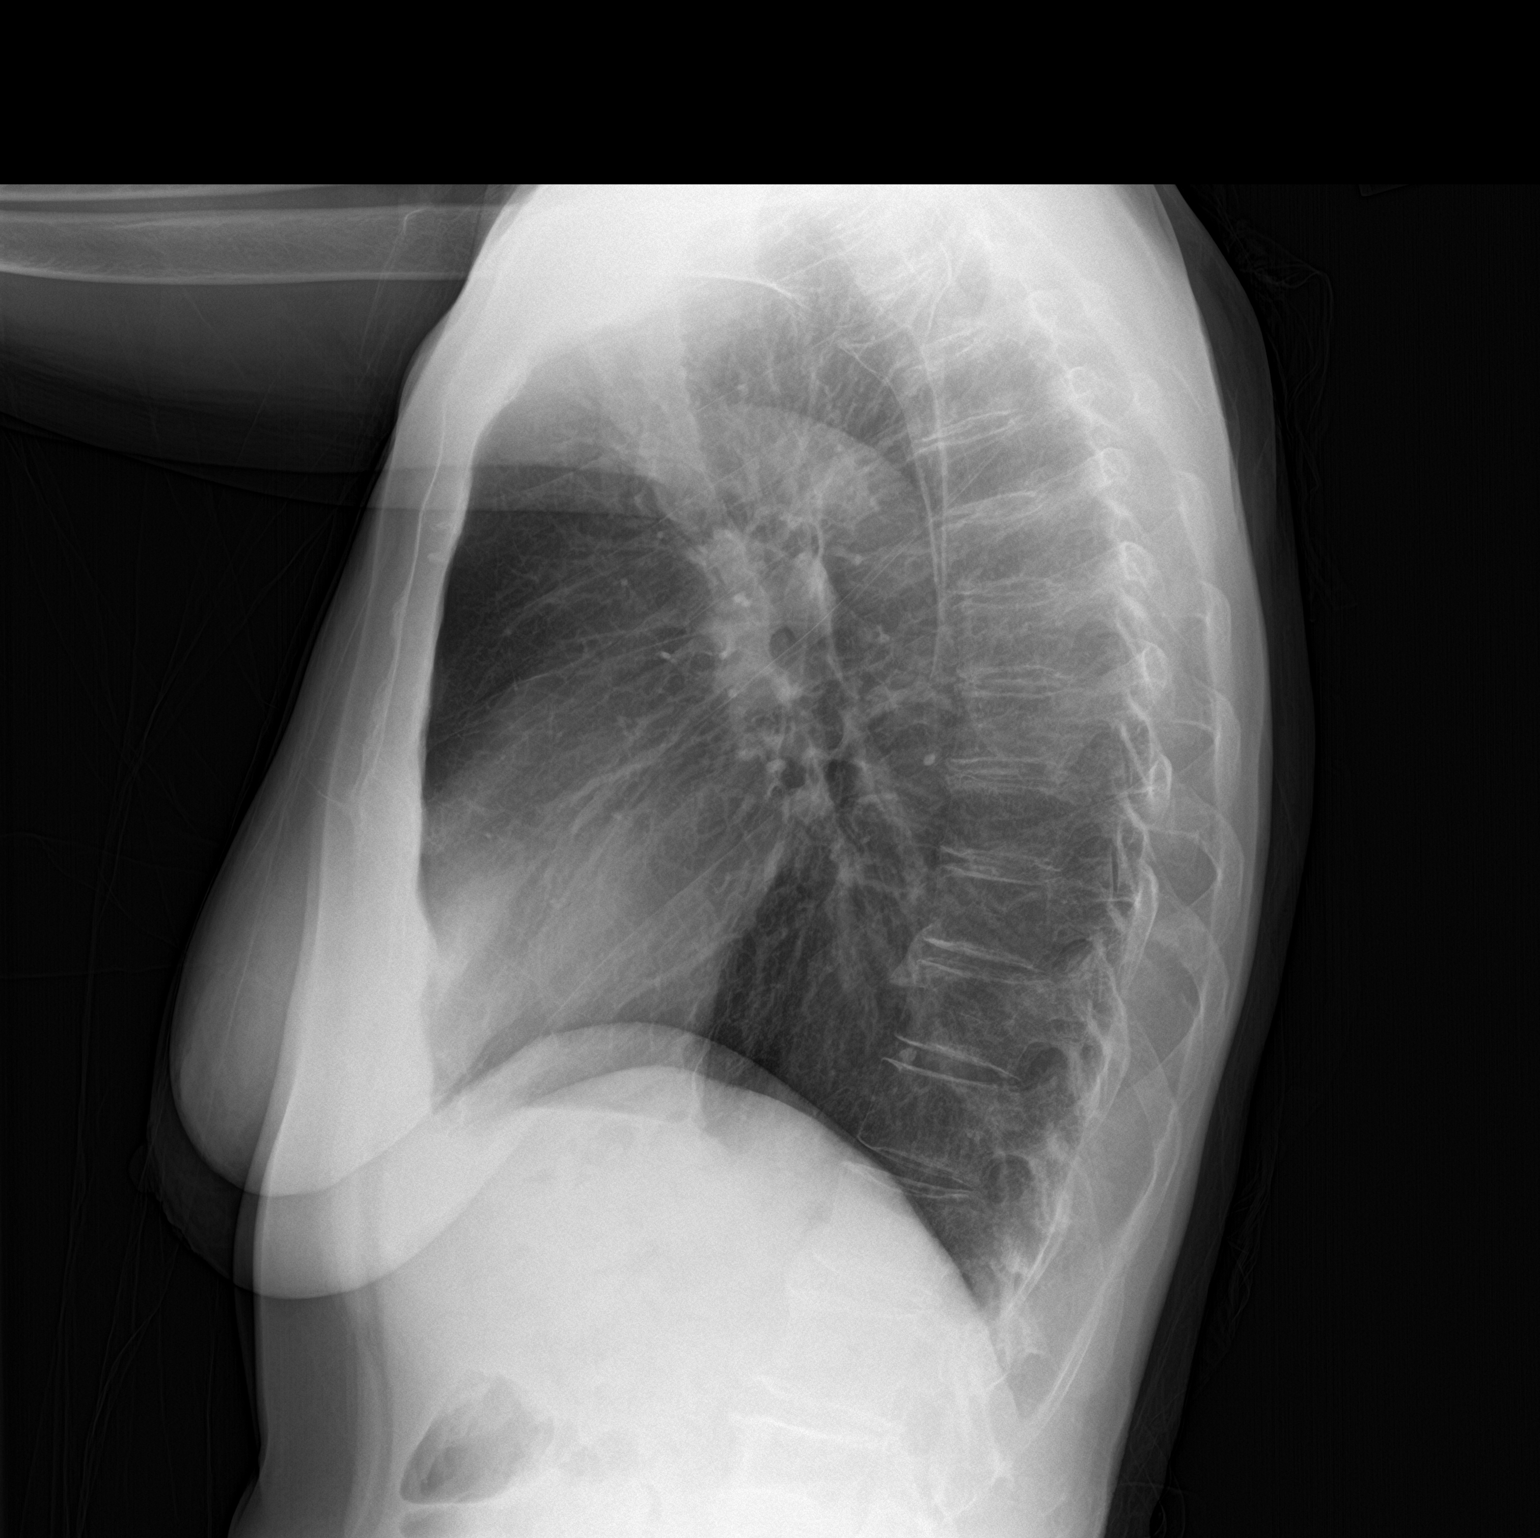

[2 of 2 positions shown; findings below may reference images not displayed]

FINDINGS: The heart size and mediastinal contours are within normal limits.
The upper lung zones are mildly hyperinflated. No large airspace
consolidation or pleural effusion. The visualized skeletal
structures are unremarkable.
IMPRESSION: No active cardiopulmonary disease.

## 2020-12-27 ENCOUNTER — Encounter (HOSPITAL_BASED_OUTPATIENT_CLINIC_OR_DEPARTMENT_OTHER): Payer: Self-pay

## 2020-12-27 ENCOUNTER — Emergency Department (HOSPITAL_BASED_OUTPATIENT_CLINIC_OR_DEPARTMENT_OTHER): Payer: Medicare HMO

## 2020-12-27 ENCOUNTER — Other Ambulatory Visit: Payer: Self-pay

## 2020-12-27 ENCOUNTER — Emergency Department (HOSPITAL_BASED_OUTPATIENT_CLINIC_OR_DEPARTMENT_OTHER)
Admission: EM | Admit: 2020-12-27 | Discharge: 2020-12-27 | Disposition: A | Discharge status: Home / Self Care | Payer: Medicare HMO | Attending: Emergency Medicine | Admitting: Emergency Medicine

## 2020-12-27 DIAGNOSIS — Z79899 Other long term (current) drug therapy: Secondary | ICD-10-CM | POA: Diagnosis not present

## 2020-12-27 DIAGNOSIS — S7002XA Contusion of left hip, initial encounter: Secondary | ICD-10-CM | POA: Diagnosis not present

## 2020-12-27 DIAGNOSIS — W19XXXA Unspecified fall, initial encounter: Secondary | ICD-10-CM | POA: Insufficient documentation

## 2020-12-27 DIAGNOSIS — S79922A Unspecified injury of left thigh, initial encounter: Secondary | ICD-10-CM | POA: Diagnosis present
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
## 2020-12-27 NOTE — ED Triage Notes (Addendum)
Pt slipped on the linoleum after having carpets cleaned and fell onto buttocks 1 week ago. C/o left hip pain. Ambulatory to room without difficulty.

## 2020-12-27 NOTE — ED Notes (Signed)
ED Provider at bedside. 

## 2020-12-27 NOTE — ED Provider Notes (Signed)
[facility] EMERGENCY DEPARTMENT Provider Note   CSN: [id number] Arrival date & time: 12/27/20  1057     History Chief Complaint  Patient presents with   Marletta Lor    Sherry Randall is a 65 y.o. female.  Left hip pain after fall last week.   The history is provided by the patient.  Fall This is a new problem. The current episode started more than 1 week ago. The problem occurs daily. The problem has been gradually improving. Pertinent negatives include no headaches. The symptoms are aggravated by walking. She has tried nothing for the symptoms. The treatment provided mild relief.      Past Medical History:  Diagnosis Date   Back problem    H pylori ulcer    Kidney stone     There are no problems to display for this patient.   Past Surgical History:  Procedure Laterality Date   CESAREAN SECTION     FINGER SURGERY     HAND SURGERY  august 2016   LITHOTRIPSY       OB History     Gravida  2   Para  2   Term      Preterm      AB      Living         SAB      IAB      Ectopic      Multiple      Live Births              Family History  Problem Relation Age of Onset   Cancer Mother        pancreatic     Social History   Tobacco Use   Smoking status: Never   Smokeless tobacco: Never  Vaping Use   Vaping Use: Never used  Substance Use Topics   Alcohol use: No   Drug use: No    Home Medications Prior to Admission medications   Medication Sig Start Date End Date Taking? Authorizing Provider  BIOTIN PO Take 1 tablet by mouth daily.    [provider]  bismuth-metronidazole-tetracycline [facility]) [phone] MG capsule Take 1 capsule by mouth 2 (two) times daily.    [provider]  cholecalciferol (VITAMIN D) 1000 units tablet Take 1,000 Units by mouth daily.    [provider]  cyclobenzaprine (FLEXERIL) 5 MG tablet Take 1 tablet (5 mg total) by mouth 2 (two) times daily as needed for muscle spasms.  06/24/19   Horton, Mayer Masker, MD  naproxen (NAPROSYN) 500 MG tablet Take 1 tablet (500 mg total) by mouth 2 (two) times daily. 06/24/19   Horton, Mayer Masker, MD  Omega-3 Fatty Acids (FISH OIL) 1000 MG CAPS Take 1 capsule by mouth daily.    [provider]  ondansetron (ZOFRAN ODT) 4 MG disintegrating tablet Take 1 tablet (4 mg total) by mouth every 8 (eight) hours as needed for nausea or vomiting. 03/07/20   Rancour, Jeannett Senior, MD  promethazine (PHENERGAN) 25 MG tablet Take 1 tablet (25 mg total) by mouth every 6 (six) hours as needed for up to 20 doses for nausea or vomiting. 06/26/20   Gianne Shugars, DO    Allergies    Sulfa antibiotics, Augmentin [amoxicillin-pot clavulanate], Avelox [moxifloxacin hcl in nacl], Biaxin [clarithromycin], Hydrocodone, Keflet [cephalexin], and Tetracyclines & related  Review of Systems   Review of Systems  Constitutional:  Negative for fever.  Musculoskeletal:  Positive for arthralgias and gait problem. Negative for  joint swelling, neck pain and neck stiffness.  Skin:  Negative for wound.  Neurological:  Negative for weakness, numbness and headaches.   Physical Exam Updated Vital Signs  ED Triage Vitals [12/27/20 1104]  Enc Vitals Group     BP      Pulse      Resp      Temp      Temp src      SpO2      Weight 108 lb (49 kg)     Height 5\' 6"  (1.676 m)     Head Circumference      Peak Flow      Pain Score 10     Pain Loc      Pain Edu?      Excl. in GC?     Physical Exam Constitutional:      General: She is not in acute distress.    Appearance: She is not ill-appearing.  Cardiovascular:     Pulses: Normal pulses.  Musculoskeletal:        General: Tenderness (TTP left hip) present.  Skin:    General: Skin is warm.     Capillary Refill: Capillary refill takes less than 2 seconds.  Neurological:     General: No focal deficit present.     Mental Status: She is alert.     Sensory: No sensory deficit.     Motor: No weakness.    ED  Results / Procedures / Treatments   Labs (all labs ordered are listed, but only abnormal results are displayed) Labs Reviewed - No data to display  EKG None  Radiology DG Hip Unilat With Pelvis 2-3 Views Left  Result Date: 12/27/2020 CLINICAL DATA:  Pain after fall. EXAM: DG HIP (WITH OR WITHOUT PELVIS) 2-3V LEFT COMPARISON:  None. FINDINGS: There is no evidence of hip fracture or dislocation. Mild degenerative change at the bilateral hip joints. Additional degenerative change within the scoliotic lower lumbar spine, incompletely imaged. IMPRESSION: No acute findings. No fracture or dislocation. Mild degenerative change. Electronically Signed   By: 14/04/2020 M.D.   On: 12/27/2020 11:44    Procedures Procedures   Medications Ordered in ED Medications - No data to display  ED Course  I have reviewed the triage vital signs and the nursing notes.  Pertinent labs & imaging results that were available during my care of the patient were reviewed by me and considered in my medical decision making (see chart for details).    MDM Rules/Calculators/A&P                           Korynne Dols is here with left hip pain after fall last week.  Pain mostly over the left lateral hip over the trochanter site.  He is able to ambulate without much discomfort.  Normal strength and sensation on exam.  Neurovascular neuromuscularly intact.  Overall suspect a bone bruise we will get an x-ray to rule out fracture.  X-ray negative for fracture.  Overall suspect bone bruise.  Discharged in good condition.  This chart was dictated using voice recognition software.  Despite best efforts to proofread,  errors can occur which can change the documentation meaning.   Final Clinical Impression(s) / ED Diagnoses Final diagnoses:  Contusion of left hip, initial encounter    Rx / DC Orders ED Discharge Orders     None        Julious Oka, DO 12/27/20 1148

## 2021-07-24 ENCOUNTER — Emergency Department (HOSPITAL_BASED_OUTPATIENT_CLINIC_OR_DEPARTMENT_OTHER)
Admission: EM | Admit: 2021-07-24 | Discharge: 2021-07-24 | Disposition: A | Discharge status: Home / Self Care | Payer: Medicare HMO | Attending: Emergency Medicine | Admitting: Emergency Medicine

## 2021-07-24 ENCOUNTER — Encounter (HOSPITAL_BASED_OUTPATIENT_CLINIC_OR_DEPARTMENT_OTHER): Payer: Self-pay

## 2021-07-24 ENCOUNTER — Other Ambulatory Visit: Payer: Self-pay

## 2021-07-24 DIAGNOSIS — Z5321 Procedure and treatment not carried out due to patient leaving prior to being seen by health care provider: Secondary | ICD-10-CM | POA: Insufficient documentation

## 2021-07-24 DIAGNOSIS — R110 Nausea: Secondary | ICD-10-CM | POA: Diagnosis not present

## 2021-07-24 DIAGNOSIS — R519 Headache, unspecified: Secondary | ICD-10-CM | POA: Insufficient documentation
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
## 2021-09-21 ENCOUNTER — Emergency Department (HOSPITAL_BASED_OUTPATIENT_CLINIC_OR_DEPARTMENT_OTHER): Payer: Medicare HMO

## 2021-09-21 ENCOUNTER — Emergency Department (HOSPITAL_BASED_OUTPATIENT_CLINIC_OR_DEPARTMENT_OTHER)
Admission: EM | Admit: 2021-09-21 | Discharge: 2021-09-21 | Disposition: A | Discharge status: Home / Self Care | Payer: Medicare HMO | Attending: Emergency Medicine | Admitting: Emergency Medicine

## 2021-09-21 ENCOUNTER — Encounter (HOSPITAL_BASED_OUTPATIENT_CLINIC_OR_DEPARTMENT_OTHER): Payer: Self-pay | Admitting: Emergency Medicine

## 2021-09-21 DIAGNOSIS — R519 Headache, unspecified: Secondary | ICD-10-CM | POA: Insufficient documentation

## 2021-09-21 DIAGNOSIS — E876 Hypokalemia: Secondary | ICD-10-CM | POA: Diagnosis not present

## 2021-09-21 DIAGNOSIS — Z79899 Other long term (current) drug therapy: Secondary | ICD-10-CM | POA: Diagnosis not present

## 2021-09-21 LAB — COMPREHENSIVE METABOLIC PANEL
ALT: 17 U/L (ref 0–44)
AST: 27 U/L (ref 15–41)
Albumin: 3.9 g/dL (ref 3.5–5.0)
Alkaline Phosphatase: 65 U/L (ref 38–126)
Anion gap: 7 (ref 5–15)
BUN: 12 mg/dL (ref 8–23)
CO2: 26 mmol/L (ref 22–32)
Calcium: 8.5 mg/dL — ABNORMAL LOW (ref 8.9–10.3)
Chloride: 102 mmol/L (ref 98–111)
Creatinine, Ser: 0.57 mg/dL (ref 0.44–1.00)
GFR, Estimated: >60 mL/min (ref >60)
Glucose, Bld: 93 mg/dL (ref 70–99)
Potassium: 3.3 mmol/L — ABNORMAL LOW (ref 3.5–5.1)
Sodium: 135 mmol/L (ref 135–145)
Total Bilirubin: 0.6 mg/dL (ref 0.3–1.2)
Total Protein: 6.8 g/dL (ref 6.5–8.1)

## 2021-09-21 LAB — CBC WITH DIFFERENTIAL/PLATELET
Abs Immature Granulocytes: 0.01 10*3/uL (ref 0.00–0.07)
Basophils Absolute: 0 10*3/uL (ref 0.0–0.1)
Basophils Relative: 1 %
Eosinophils Absolute: 0.1 10*3/uL (ref 0.0–0.5)
Eosinophils Relative: 2 %
HCT: 39 % (ref 36.0–46.0)
Hemoglobin: 13 g/dL (ref 12.0–15.0)
Immature Granulocytes: 0 %
Lymphocytes Relative: 36 %
Lymphs Abs: 2.2 10*3/uL (ref 0.7–4.0)
MCH: 29.8 pg (ref 26.0–34.0)
MCHC: 33.3 g/dL (ref 30.0–36.0)
MCV: 89.4 fL (ref 80.0–100.0)
Monocytes Absolute: 0.4 10*3/uL (ref 0.1–1.0)
Monocytes Relative: 7 %
Neutro Abs: 3.2 10*3/uL (ref 1.7–7.7)
Neutrophils Relative %: 54 %
Platelets: 264 10*3/uL (ref 150–400)
RBC: 4.36 MIL/uL (ref 3.87–5.11)
RDW: 14.3 % (ref 11.5–15.5)
WBC: 5.9 10*3/uL (ref 4.0–10.5)
nRBC: 0 % (ref 0.0–0.2)

## 2021-09-21 LAB — MAGNESIUM: Magnesium: 2.1 mg/dL (ref 1.7–2.4)

## 2021-09-21 LAB — LIPASE, BLOOD: Lipase: 58 U/L — ABNORMAL HIGH (ref 11–51)

## 2021-09-21 MED ORDER — LIDOCAINE 5 % EX PTCH
1.0000 | MEDICATED_PATCH | CUTANEOUS | Status: DC
  Administered 2021-09-21: 1 via TRANSDERMAL
  Filled 2021-09-21: qty 1

## 2021-09-21 MED ORDER — METOCLOPRAMIDE HCL 5 MG PO TABS: 5.0000 mg | ORAL_TABLET | Freq: Four times a day (QID) | ORAL | 0 refills | Status: AC | PRN

## 2021-09-21 MED ORDER — METOCLOPRAMIDE HCL 5 MG/ML IJ SOLN
5.0000 mg | Freq: Once | INTRAMUSCULAR | Status: AC
  Administered 2021-09-21: 5 mg via INTRAVENOUS
  Filled 2021-09-21: qty 2

## 2021-09-21 MED ORDER — DIPHENHYDRAMINE HCL 50 MG/ML IJ SOLN
25.0000 mg | Freq: Once | INTRAMUSCULAR | Status: DC
  Filled 2021-09-21: qty 1

## 2021-09-21 MED ORDER — POTASSIUM CHLORIDE CRYS ER 20 MEQ PO TBCR
40.0000 meq | EXTENDED_RELEASE_TABLET | Freq: Once | ORAL | Status: AC
  Administered 2021-09-21: 40 meq via ORAL
  Filled 2021-09-21: qty 2

## 2021-09-21 MED ORDER — POTASSIUM CHLORIDE ER 10 MEQ PO TBCR: 10.0000 meq | EXTENDED_RELEASE_TABLET | Freq: Every day | ORAL | 0 refills | Status: AC

## 2021-09-21 MED ORDER — SODIUM CHLORIDE 0.9 % IV BOLUS
1000.0000 mL | Freq: Once | INTRAVENOUS | Status: AC
  Administered 2021-09-21: 1000 mL via INTRAVENOUS

## 2021-09-21 MED ORDER — MAGNESIUM SULFATE 2 GM/50ML IV SOLN
2.0000 g | Freq: Once | INTRAVENOUS | Status: AC
  Administered 2021-09-21: 2 g via INTRAVENOUS
  Filled 2021-09-21: qty 50

## 2021-09-21 NOTE — Discharge Instructions (Addendum)
It was a pleasure caring for you today in the emergency department.  Please return to the emergency department for any worsening or worrisome symptoms.   Please see your pcp in the next 2-3 days for repeat potassium check  Please follow up with your headache specialist

## 2021-09-21 NOTE — ED Notes (Signed)
ED Provider at bedside. 

## 2021-09-21 NOTE — ED Triage Notes (Signed)
Pt arrives pov, steady gait to triage, endorses nerve block x 7 wks pta. Pt tx by neuro x 1 week pta, received another injection. Pt endorses Left side HA, left side mouth numbness, and bilateral numbness of feet today 2 hrs pta. Also reports intermittent LT ear pain x 5 weeks, and nausea. Speech clear, AOx4

## 2021-09-21 NOTE — ED Provider Notes (Signed)
[facility] EMERGENCY DEPARTMENT Provider Note   CSN: [id number] Arrival date & time: 09/21/21  1719     History  Chief Complaint  Patient presents with   Headache    Sherry Randall is a 66 y.o. female.  Patient as above with significant medical history as below, including postconcussive syndrome, recurrent headaches, who presents to the ED with complaint of headache.  Patient follows with Dr. Elesa Massed neurology for her headaches, chronic migraines and postconcussive syndrome.  She reports over the past few weeks she has been having worsening headaches from her baseline, she was seen on the 23rd by her neurologist.  She visited the ED 8/27 at [facility] for headache     Past Medical History:  Diagnosis Date   Back problem    H pylori ulcer    Kidney stone     Past Surgical History:  Procedure Laterality Date   CESAREAN SECTION     FINGER SURGERY     HAND SURGERY  august 2016   LITHOTRIPSY       The history is provided by the patient. No language interpreter was used.  Headache Associated symptoms: ear pain   Associated symptoms: no abdominal pain, no back pain, no cough, no fever, no nausea and no vomiting        Home Medications Prior to Admission medications   Medication Sig Start Date End Date Taking? Authorizing Provider  metoCLOPramide (REGLAN) 5 MG tablet Take 1 tablet (5 mg total) by mouth every 6 (six) hours as needed (headache). 09/21/21  Yes Tanda Rockers A, DO  potassium chloride (KLOR-CON) 10 MEQ tablet Take 1 tablet (10 mEq total) by mouth daily for 3 days. 09/21/21 09/24/21 Yes Tanda Rockers A, DO  BIOTIN PO Take 1 tablet by mouth daily.    [provider]  bismuth-metronidazole-tetracycline [facility]) [phone] MG capsule Take 1 capsule by mouth 2 (two) times daily.    [provider]  cholecalciferol (VITAMIN D) 1000 units tablet Take 1,000 Units by mouth daily.    [provider]  cyclobenzaprine (FLEXERIL) 5 MG tablet Take 1  tablet (5 mg total) by mouth 2 (two) times daily as needed for muscle spasms. 06/24/19   Horton, Mayer Masker, MD  naproxen (NAPROSYN) 500 MG tablet Take 1 tablet (500 mg total) by mouth 2 (two) times daily. 06/24/19   Horton, Mayer Masker, MD  Omega-3 Fatty Acids (FISH OIL) 1000 MG CAPS Take 1 capsule by mouth daily.    [provider]  ondansetron (ZOFRAN ODT) 4 MG disintegrating tablet Take 1 tablet (4 mg total) by mouth every 8 (eight) hours as needed for nausea or vomiting. 03/07/20   Rancour, Jeannett Senior, MD  promethazine (PHENERGAN) 25 MG tablet Take 1 tablet (25 mg total) by mouth every 6 (six) hours as needed for up to 20 doses for nausea or vomiting. 06/26/20   Curatolo, Adam, DO      Allergies    Sulfa antibiotics, Augmentin [amoxicillin-pot clavulanate], Avelox [moxifloxacin hcl in nacl], Biaxin [clarithromycin], Hydrocodone, Keflet [cephalexin], and Tetracyclines & related    Review of Systems   Review of Systems  Constitutional:  Negative for activity change and fever.  HENT:  Positive for ear pain. Negative for facial swelling and trouble swallowing.   Eyes:  Negative for discharge and redness.  Respiratory:  Negative for cough and shortness of breath.   Cardiovascular:  Negative for chest pain and palpitations.  Gastrointestinal:  Negative for abdominal pain, nausea and vomiting.  Genitourinary:  Negative for dysuria and flank pain.  Musculoskeletal:  Negative for back pain and gait problem.  Skin:  Negative for pallor and rash.  Neurological:  Positive for headaches. Negative for syncope.       Tingling    Physical Exam Updated Vital Signs BP 119/68   Pulse 71   Temp 97.8 F (36.6 C) (Oral)   Resp 18   Ht 5\' 6"  (1.676 m)   Wt 49.4 kg   SpO2 100%   BMI 17.59 kg/m  Physical Exam Vitals and nursing note reviewed.  Constitutional:      General: She is not in acute distress.    Appearance: Normal appearance. She is not ill-appearing or diaphoretic.  HENT:     Head:  Normocephalic and atraumatic.     Right Ear: External ear normal.     Left Ear: External ear normal.     Nose: Nose normal.     Mouth/Throat:     Mouth: Mucous membranes are moist.  Eyes:     General: No scleral icterus.       Right eye: No discharge.        Left eye: No discharge.     Extraocular Movements: Extraocular movements intact.     Conjunctiva/sclera: Conjunctivae normal.     Pupils: Pupils are equal, round, and reactive to light.  Neck:     Meningeal: Brudzinski's sign and Kernig's sign absent.  Cardiovascular:     Rate and Rhythm: Normal rate and regular rhythm.     Pulses: Normal pulses.     Heart sounds: Normal heart sounds.  Pulmonary:     Effort: Pulmonary effort is normal. No respiratory distress.     Breath sounds: Normal breath sounds.  Abdominal:     General: Abdomen is flat.     Tenderness: There is no abdominal tenderness.  Musculoskeletal:        General: Normal range of motion.     Cervical back: Full passive range of motion without pain and normal range of motion.     Right lower leg: No edema.     Left lower leg: No edema.  Skin:    General: Skin is warm and dry.     Capillary Refill: Capillary refill takes less than 2 seconds.  Neurological:     Mental Status: She is alert and oriented to person, place, and time.     GCS: GCS eye subscore is 4. GCS verbal subscore is 5. GCS motor subscore is 6.     Cranial Nerves: Cranial nerves 2-12 are intact. No facial asymmetry.     Sensory: Sensation is intact.     Motor: Motor function is intact. No tremor.     Coordination: Coordination is intact. Finger-Nose-Finger Test normal.     Gait: Gait is intact.     Comments: Strength 5/5 BLLE BLUE  Psychiatric:        Mood and Affect: Mood normal.        Behavior: Behavior normal.     ED Results / Procedures / Treatments   Labs (all labs ordered are listed, but only abnormal results are displayed) Labs Reviewed  COMPREHENSIVE METABOLIC PANEL - Abnormal;  Notable for the following components:      Result Value   Potassium 3.3 (*)    Calcium 8.5 (*)    All other components within normal limits  LIPASE, BLOOD - Abnormal; Notable for the following components:   Lipase 58 (*)    All other components  within normal limits  MAGNESIUM  CBC WITH DIFFERENTIAL/PLATELET    EKG None  Radiology No results found.  Procedures Procedures    Medications Ordered in ED Medications  magnesium sulfate IVPB 2 g 50 mL (0 g Intravenous Stopped 09/21/21 2104)  metoCLOPramide (REGLAN) injection 5 mg (5 mg Intravenous Given 09/21/21 1947)  sodium chloride 0.9 % bolus 1,000 mL (0 mLs Intravenous Stopped 09/21/21 2123)  potassium chloride SA (KLOR-CON M) CR tablet 40 mEq (40 mEq Oral Given 09/21/21 2144)    ED Course/ Medical Decision Making/ A&P                           Medical Decision Making Amount and/or Complexity of Data Reviewed Labs: ordered. Radiology: ordered.  Risk Prescription drug management.   This patient presents to the ED with chief complaint(s) of ha, arm/leg tingling, intermittent ear pain with pertinent past medical history of recurrent migraine ha, post concussive syndrome which further complicates the presenting complaint. The complaint involves an extensive differential diagnosis and also carries with it a high risk of complications and morbidity.    Differential diagnosis includes but is not exclusive to subarachnoid hemorrhage, meningitis, encephalitis, previous head trauma, cavernous venous thrombosis, muscle tension headache, glaucoma, temporal arteritis, migraine or migraine equivalent, etc.  . Serious etiologies were considered.   The initial plan is to screening labs, imaging, migraine cocktail  >> patient does report some intermittent peri-oral tingling, no numbness as was documented in the triage note. These symptoms have resolved by the time of my assessment in the ED, have been intermittent since the onset.  No focal  deficits on exam.  NIH stroke scale of 0.  Not TNK candidate   Additional history obtained: Additional history obtained from  na Records reviewed Care Everywhere/External Records and Primary Care Documents prior ed visits, home meds, labs and imaging  >> [facility] Dr. Elesa Massed neurology for postconcussive syndrome and chronic migraines  Independent labs interpretation:  The following labs were independently interpreted:  Potassium mildly depleted at 3.3.  Lipase mildly elevated 58.  No abdominal pain, nausea or vomiting.  Labs otherwise unremarkable  Independent visualization of imaging: CT head ordered given patient feels headache is slightly different from her baseline typical headaches.  No focal neurologic deficits on exam - I independently visualized the following imaging with scope of interpretation limited to determining acute life threatening conditions related to emergency care: CT head, which revealed no acute process  Cardiac monitoring was reviewed and interpreted by myself which shows NSR  Treatment and Reassessment: Migraine cocktail, IV fluids, potassium replacement>> symptoms have resolved  Consultation: - Consulted or discussed management/test interpretation w/ external professional: Not applicable  Consideration for admission or further workup: Admission was considered    Patient presents with headache. Based on the patient's history and physical there is very low clinical suspicion for significant intracranial pathology. The headache was not sudden onset, not maximal at onset, there are no neurologic findings on exam, the patient does not have a fever, the patient does not have any jaw claudication, the patient does not endorse a clotting disorder, patient denies any trauma or eye pain and the headache is not associated with dizziness, weakness on one side of the body, diplopia, vertigo, slurred speech, or ataxia. Given the extremely low risk of these  diagnoses further testing and evaluation for these possibilities does not appear to be indicated at this time.   The patient improved  significantly and was discharged in stable condition. Detailed discussions were had with the patient regarding current findings, and need for close f/u with PCP or on call doctor. The patient has been instructed to return immediately if the symptoms worsen in any way for re-evaluation. Patient verbalized understanding and is in agreement with current care plan. All questions answered prior to discharge.  Social Determinants of health: Social History   Tobacco Use   Smoking status: Never   Smokeless tobacco: Never  Vaping Use   Vaping Use: Never used  Substance Use Topics   Alcohol use: No   Drug use: No            Final Clinical Impression(s) / ED Diagnoses Final diagnoses:  Bad headache  Hypokalemia    Rx / DC Orders ED Discharge Orders          Ordered    potassium chloride (KLOR-CON) 10 MEQ tablet  Daily        09/21/21 2158    metoCLOPramide (REGLAN) 5 MG tablet  Every 6 hours PRN        09/21/21 2158              Sloan Leiter, DO 09/24/21 [phone]

## 2022-05-21 ENCOUNTER — Emergency Department (HOSPITAL_BASED_OUTPATIENT_CLINIC_OR_DEPARTMENT_OTHER)
Admission: EM | Admit: 2022-05-21 | Discharge: 2022-05-22 | Discharge status: Against Medical Advice | Payer: Medicare Other | Attending: Emergency Medicine | Admitting: Emergency Medicine

## 2022-05-21 ENCOUNTER — Other Ambulatory Visit: Payer: Self-pay

## 2022-05-21 ENCOUNTER — Encounter (HOSPITAL_BASED_OUTPATIENT_CLINIC_OR_DEPARTMENT_OTHER): Payer: Self-pay | Admitting: Emergency Medicine

## 2022-05-21 DIAGNOSIS — R300 Dysuria: Secondary | ICD-10-CM | POA: Diagnosis present

## 2022-05-21 DIAGNOSIS — Z5321 Procedure and treatment not carried out due to patient leaving prior to being seen by health care provider: Secondary | ICD-10-CM | POA: Insufficient documentation

## 2022-05-21 NOTE — ED Triage Notes (Signed)
Pt reports dysuria, frequency and foul-smelling urine x 2 wks

## 2023-03-02 NOTE — ED Provider Notes (Signed)
 Patient placed in First Look pathway, seen and evaluated for chief complaint of patient states she is dry and has numbness all over and continue to repeat the phrase that I am going to die.  She reports taking an antibiotic today with her vitamins and states symptoms started shortly after.  No history of antibiotic allergies. Pertinent exam findings include anxious with mildly dry mucosa.. Based on initial evaluation, labs are currently indicated and radiology studies are not currently indicated as allowed for current processes and treatments as applicable in a triage setting and could be different than if patient were seen in a main treatment area or dependent on labs/imagining after results are displayed.  Patient counseled on process, plan, and necessity for staying for completing the evaluation.   This document serves as a record of services personally performed by Tamea Ford PA-C.    Emergency Department Provider Note  No chief complaint on file.  History  *Chart reviewed and pertinent medical history confirmed with patient and/or external source collateral (as needed).  Sherry Randall is a 69 y.o. female w/ h/o of HLN, prediabetes, arthritis, GERD, presenting to the ED for anxiety, onset today. Patient is poor historian due to acute distress, patient's son at bedside assisted with patient history. Around 5:30 PM the patient reports taking prescribed amoxicillin and now endorses feeling like she is dying. She reports that she was given antibiotics for a UTI and that today was her third day of taking her antibiotics. She also endorses taking many supplements and vitamins and states she is on a month long detox, and that she feels like the amoxicillin and her supplements are causing her to die. Patient states her mouth is dry and she is very weak. She also states that her entire body is numb and endorses some abdominal pain associated with dry heaving. She reports being fine  yesterday. Patient's son reports that today he got a call from his hairstylist that his mother had coincidentally stopped in her salon and complained of lightheadedness, weakness, and nausea. Son reports that his mother states that her talking is off, and that she looks very sick since he last saw her a week ago. She denies any homicidal intent, suicidal intent, or auditory hallucinations, but she does endorse visual hallucinations of angels and jesus christ above her that she states are going to take her up to heaven.    After reassessment and patient's anxiety improved, she was able to clarify that the amoxicillin was not for her UTI (patient's last urine was fairly not concerning for UTI, in addition had a negative urine culture), but was actually prescribed for her recently diagnosed H. pylori.  Previous Chart Review  Reviewed urology office visit from 02/27/2023 for UTI symptoms.   Past Medical History Past Medical History:  Diagnosis Date  . Allergy   . Anxiety   . Arthritis   . Barrett's esophagus   . Bilateral dry eyes   . Cataract   . Chronic low back pain    disabled  . GERD (gastroesophageal reflux disease)   . Hand injury, right, initial encounter 05/23/2016  . Hiatal hernia   . Hyperlipidemia   . Intervertebral disk disease   . Kidney stones   . Neuromuscular disorder (CMD)    carpal tunnel right wrist (traumatic)  . Urinary tract infection     Past Surgical History Past Surgical History:  Procedure Laterality Date  . CESAREAN SECTION, UNSPECIFIED    . COLONOSCOPY    .  FINGER SURGERY    . FUNCTIONAL ENDOSCOPIC SINUS SURGERY Bilateral 11/22/2013   (FESS) LEVEL 2;  Norleen JONETTA Sample, MD  . HAND SURGERY    . HAND SURGERY    . LITHOTRIPSY    . UPPER GASTROINTESTINAL ENDOSCOPY       Medications Current Outpatient Medications  Medication Instructions  . amoxicillin (AMOXIL) 1,000 mg, oral, 2 times daily  . clarithromycin (BIAXIN) 500 mg, oral, 2 times daily  .  ipratropium (ATROVENT) 42 mcg (0.06 %) spry nasal spray 2 sprays, Each Nostril, 2 times daily  . omeprazole magnesium  (PRILOSEC) 20 mg, oral, 2 times daily   Allergies Allergies  Allergen Reactions  . Sulfa (Sulfonamide Antibiotics) Itching  . Clarithromycin Itching and Other (See Comments)  . Penicillins GI Intolerance  . Cephalexin Other (See Comments)  . Doxycycline GI Intolerance    06/13/21 patient states can take antibiotic without adverse effect  . Hydrocodone  GI Intolerance  . Latex, Natural Rubber Itching  . Metronidazole GI Intolerance  . Moxifloxacin GI Intolerance  . Tamsulosin Other (See Comments)    Vision changes  . Tetracyclines GI Intolerance  . Gabapentin GI Intolerance    Fatigue  . Ondansetron  Hcl Palpitations    Family History Family History  Problem Relation Name Age of Onset  . Cataracts Mother    . Colon cancer Mother  69  . Pancreatic cancer Mother  36  . Hip fracture Maternal Grandmother    . Cataracts Maternal Grandmother    . Glaucoma Maternal Grandmother    . Stomach cancer Maternal Grandfather         tumor in stomach  . Cataracts Maternal Grandfather    . Cataracts Father    . Cataracts Paternal Grandmother    . Cataracts Paternal Grandfather    . Pancreatic cancer Sister Sari 56       03/2019 pancreas tumor, going to [facility] for workup  . Other Sister [place]        2019 - pancreas tumor removed at [facility]  . Blindness Neg Hx    . Macular degeneration Neg Hx    . Breast cancer Neg Hx      Social History Social History   Tobacco Use  . Smoking status: Never  . Smokeless tobacco: Never  Vaping Use  . Vaping status: Never Used  Substance Use Topics  . Alcohol use: Never  . Drug use: Never   Physical Exam   Vitals:   03/02/23 2200 03/03/23 0039 03/03/23 0100 03/03/23 0618  BP: 146/76 134/72 122/65 104/66  BP Location:  Right arm  Right arm  Patient Position:  Lying  Lying  Pulse: 80 78 73 70  Resp: 14 16 19 18   Temp:       TempSrc:      SpO2: 100% 99% 97% 97%  Weight:      Height:        HEENT: normocephalic, supple w/o meningismus, pupils equal/round/reactive, extraocular movements intact Pulmonary: normal respiratory effort, CTAB, no stridor Cardiovascular: normal rate/rhythm, no audible murmurs Abdominal: soft, non-tender, non-distended, no guarding/rebound Musculoskeletal: atraumatic w/o obvious deformity, appropriate ROM Extremities: warm, dry, no pitting edema, 2+ pulses in b/l lower extremities Neurologic: A&O x3, pupils cross midline, moving all extremities, neuro exam limited by patients distress Psychiatric: anxious, uncooperative   Medical Decision Making (MDM)   Differential Diagnosis DDX: depression, suicidal attempt, anxiety, psychosis, delerium, drug/alcohol abuse/dependence, schizophrenia, adjustment reaction   ED Interventions and Subspecialty Consults Orders Placed This Encounter  Procedures  . Urine Culture  . XR Chest 1 View  . CT Head WO Contrast  . CBC with Differential  . Comprehensive Metabolic Panel  . Drug of Abuse 7 Panel  . CBC with Differential  . Acetaminophen  Level  . Ethanol  . Salicylate Level  . TSH With Reflex To Free T4  . Urinalysis with Reflex to Microscopic  . Cannabinoid (THC) Confirmation, Urine MS  . Straight cath    New Medications Ordered This Visit  Medications  . sodium chloride  (bolus) 0.9 % bolus 1,000 mL  . hydrOXYzine (ATARAX) tablet 25 mg  . famotidine (PEPCID) tablet 20 mg  . metoclopramide  (REGLAN ) tablet 10 mg  . alum-mag hydroxide-simethicone (MAALOX, MYLANTA) 200-200-20 mg/5 mL suspension 30 mL  . acetaminophen  (TYLENOL ) tablet 650 mg  . promethazine  (PHENERGAN ) tablet 12.5 mg    Procedures Procedures     Independent Interpretation  *All imaging, laboratory, and diagnostic studies reviewed and independently interpreted by me, pertinent findings outlined below. Unless otherwise noted, institutional lab reference values  used and formal imaging study readings provided by a radiologist.  Lab and Imaging Interpretation: CBC unremarkable without white count, TSH within normal limits, CMP unremarkable besides mild hyperglycemia, UA completely negative, drug panel positive for marijuana.  CT head without hemorrhage, x-ray without pneumonia  EKG Interpretation: N/A  Cardiac Monitoring Interpretation: in acute distress secondary to anxiety, initially tachycardic,  tachypnea present, no hypotension     68 y.o. female with PMH as stated above presenting to the ED for anxiety/questionable psychosis.ED evaluation and interventions as outlined above. patient not cooperative with history taking, not cooperative with exam, states that she cannot walk, states that she cannot do anything because her body is numb.  Family states no previous history of psych disease.  This would be unlikely to be a due presentation given the patient's age.  She said that she saw angels and Jesus talking to her.  I was initially concern for a UTI since patient stated she was diagnosed with a UTI and was recently started antibiotics.  But looking back at her chart review urine culture was negative.  There was another concern for patient's antibiotic mixing or having a reaction with the many unknown herbs and supplements she takes at home (that the patient does not remember).  I asked her family to go to the house and get the names of the supplements.  I ordered a drug panel which was positive for marijuana which explains almost all of her symptoms including the psychosis, dry mucous membranes, anxiousness and mild abdominal pain.  Patient said that she remembers taking a THC candy at the store at around 5 PM (her onset of symptoms were around 530pm).  I told patient to refrain from any further marijuana use.  Ordered fluids, stable for DC ED Course as of 03/03/23 1047  Fri Mar 03, 2023  0023 More coherent, patient more cooperative.  Following directions.   Not anxious.  Abdominal exam improved.  Vitals normalized.  Positive for marijuana which explains patient's symptoms and anxiety and dry oral mucosa .DC after fluids. [HN]    ED Course User Index [HN] Miquel Lax, MD    Dispo as outlined below.  Overall Clinical Impression:  1. Anxiety state   2. Cannabis intoxication delirium (CMD)    ED Disposition  Discharge  Clinical Complexity Past medical/surgical history increasing complexity of ED encounter outlined above in HPI. As stated in HPI, further history obtained from:Family Member . Factors Impacting ED  Encounter Risk: Discussion regarding hospitalization, Discussion regarding prescription drug management , and Access to health care services. Patient presentation most consistent with acute presentation with potential threat to life or bodily function.  *Prior to discharge, patient was given relevant instructions and return precautions. Follow-up recommendations and new prescriptions as below.   Prescriptions ED Prescriptions   None     Follow-up Ike Jenkins Ditty, MD [address] [place] [address] [phone]  Schedule an appointment as soon as possible for a visit in 3 days     *Provider time spent in patient care today, inclusive of (but not limited to) review of medical records, real-time clinical reassessment, review of diagnostic studies, and discharge preparation, was greater than 30 minutes.  *This document was created using the aid of voice recognition Dragon dictation software.  This document serves as a record of services personally performed by Miquel Lax, MD. It was created on their behalf by Karena LOISE Hurst, Scribe, a trained medical scribe. The creation of this record is the provider's dictation and/or activities during the visit.   Electronically signed by: Karena LOISE Hurst, Scribe 03/02/2023 10:47 AM

## 2023-05-06 NOTE — ED Provider Notes (Signed)
 [facility] Tayla Sarna, DO  [facility] ED [address] [facility] [address] [place] [address] [place] [address]  Sloan Galentine   [birth year] (68 y.o.)    Chief Complaint: Dizziness, Headache (Patient reports pain to right occipital area of head onset 2-3 hours ago. ), and Fatigue  History of Present Illness: Patient is a 68 year old female who presents to the emergency department for evaluation of right occipital pain and dizziness.  Patient states that the symptoms started around 2 hours ago shortly after she drank water from a restaurant that she believes had chemicals in it.  She states that the water was the worst she has ever tasted.  She also reports eating food from this restaurant as well with that she believes was also of bad quality.  She denies any additional symptoms or complaints at this time.   History provided by:  Patient                  Medical and Surgical History: Hadleigh has no past medical history on file. Ginelle has a past surgical history that includes Cesarean section, low transverse.    ED Triage Vital Signs: Temp: 36.8 C (98.3 F), Heart Rate: 69, Resp: 16, BP: 168/74, SpO2: 100 %, Temp Source: Oral, Patient Position: Sitting  Physical Exam Vitals and nursing note reviewed.  Constitutional:      General: She is not in acute distress.    Appearance: She is well-developed. She is not ill-appearing, toxic-appearing or diaphoretic.  HENT:     Head: Normocephalic and atraumatic.     Mouth/Throat:     Mouth: Mucous membranes are moist.     Pharynx: Oropharynx is clear.  Eyes:     General: No visual field deficit or scleral icterus.    Extraocular Movements: Extraocular movements intact.     Conjunctiva/sclera: Conjunctivae normal.  Cardiovascular:     Rate and Rhythm: Normal rate and regular rhythm.     Heart sounds: No murmur heard. Pulmonary:     Effort: Pulmonary effort is normal. No respiratory distress.     Breath  sounds: Normal breath sounds.  Abdominal:     General: There is no distension.     Palpations: Abdomen is soft.     Tenderness: There is no abdominal tenderness. There is no guarding.  Musculoskeletal:        General: No swelling. Normal range of motion.     Cervical back: Normal range of motion and neck supple. No rigidity.  Skin:    General: Skin is warm and dry.     Capillary Refill: Capillary refill takes less than 2 seconds.     Findings: No rash.  Neurological:     Mental Status: She is alert and oriented to person, place, and time.     GCS: GCS eye subscore is 4. GCS verbal subscore is 5. GCS motor subscore is 6.     Cranial Nerves: No cranial nerve deficit, dysarthria or facial asymmetry.     Sensory: No sensory deficit.     Motor: No weakness.  Psychiatric:        Mood and Affect: Mood normal.     Procedures Diagnoses as of 05/06/23 2033  Dizziness   Medical Decision Making Patient presents for evaluation of right occipital headache onset 2 to 3 hours prior to arrival after eating and drinking water at a restaurant that she believes had chemicals present.  Patient seen and evaluated, hemodynamically stable, no acute  distress.  Physical exam unremarkable without focal neurologic deficits noted.  Patient requesting promethazine  for nausea.  CT head ordered and patient given promethazine .  On reassessment patient resting comfortably.  CT final radiology report negative for any acute pathology.  Patient is stable for discharge at this time. Results of any labs and/or imaging explained and discussed to apparent patient satisfaction. Any and all questions answered and return precautions provided. Medication list reviewed. Patient instructed to follow up with their PCP or necessary specialists in the next 1-2 days if possible. Patient notes their understanding and is agreeable to plan and discharge at this time. Patient stable at time of discharge.  Amount and/or Complexity of Data  Reviewed Labs: ordered. Radiology: ordered. ECG/medicine tests: ordered.  Risk Prescription drug management.   Disposition: DISCHARGE  ED Prescriptions     Medication Sig Dispense Start Date End Date Auth. Provider   promethazine  (Phenergan ) 25 mg tablet (Expired) Take 1 tablet (25 mg) by mouth every 8 (eight) hours if needed for nausea or vomiting for up to 3 days. 9 tablet 04/29/2023 05/02/2023 Sarna, Tayla, DO             Sarna, Tayla, DO 05/06/23 2036

## 2023-10-27 NOTE — ED Provider Notes (Signed)
 [facility]  ED Provider Note  Sherry Randall 68 y.o. female DOB: [birth year] MRN: [id number] History   Chief Complaint  Patient presents with  . Leg Pain    Patient states she had an injury to her right thigh 5 weeks ago.  Patient has seen orthopedic for same.  Patient states orthopedic only did plan film xray, wanted patient to follow up with him if pain did not improve. Patient continuing to have numbness on the lateral portion of the thigh.  States spoke with orthopedic this week and was prescribed lyrica, but did not pick it up.    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  Patient has been having recurrent pain which mostly describes a burning sensation to the right lateral aspect of her leg.  She has had some mild numbness but denies any weakness, back pain, bowel or bladder incontinence or urinary retention.  Patient has been seen by orthopedics and recently prescribed Lyrica which she has not picked up yet.       Past Medical History:  Diagnosis Date  . Helicobacter pylori (H. pylori)   . Hiatal hernia   . Hyperlipidemia     Past Surgical History:  Procedure Laterality Date  . Cesarean section     x 2  . Finger surgery     3 finger right hand  . Lithotripsy    . Sinus surgery      Social History   Substance and Sexual Activity  Alcohol Use No   Tobacco Use History[1] E-Cigarettes  . Vaping Use Never User   . Start Date    . Cartridges/Day    . Quit Date     Social History   Substance and Sexual Activity  Drug Use No         Allergies[2]  Discharge Medication List as of 10/27/2023 11:47 PM     CONTINUE these medications which have NOT CHANGED   Details  acetaminophen  (TYLENOL ) 325 mg tablet Take two tablets (650 mg dose) by mouth every 6 (six) hours as needed for Pain., Historical Med    albuterol  sulfate HFA  (PROVENTIL ,VENTOLIN ,PROAIR ) 108 (90 Base) MCG/ACT inhaler Inhale two puffs into the lungs every 6 (six) hours as needed., Starting Mon 09/12/2022, Until Tue 09/12/2023 at 2359, Normal    azelastine (ASTELIN) 0.1 % nasal spray one spray by Nasal route every 4 (four) hours as needed., Historical Med    Cholecalciferol (VITAMIN D-1000 MAX ST) 25 MCG (1000 UT) tablet Take one tablet (1,000 Units dose) by mouth daily., Historical Med    fluticasone propionate (FLONASE) 50 mcg/actuation nasal spray one spray by Both Nostrils route every 4 (four) hours as needed., Starting Sun 03/27/2022, Historical Med    promethazine  (PHENERGAN ,PHENADOZ) 25 MG suppository Place one suppository (25 mg dose) rectally every 6 (six) hours as needed for Nausea., Starting Wed 09/15/2021, Historical Med    promethazine -dextromethorphan (PROMETHAZINE -DM) 6.25-15 MG/5ML syrup Take 5 mLs by mouth 3 (three) times a day as needed for Cough., Starting Mon 09/12/2022, Normal    thiamine 100 mg tablet Take one tablet (100 mg dose) by mouth once a week., Starting Thu 03/10/2022, Historical Med    tretinoin (RETIN-A) 0.05% cream Apply one application. topically at bedtime., Starting Sat 01/02/2021, Historical Med    triamcinolone (NASACORT,NASAL ALLERGY 24H) 55 MCG/ACT nasal inhaler two sprays by Nasal route every 4 (four)  hours as needed., Starting Tue 12/21/2021, Historical Med    valacyclovir (VALTREX) 1000 mg tablet Take one tablet (1,000 mg dose) by mouth every 4 (four) hours as needed., Historical Med        Primary Survey  Primary Survey  Review of Systems   Review of Systems  All other systems reviewed and are negative. Except as per stated in HPI  Physical Exam   ED Triage Vitals [10/27/23 2149]  BP 140/84  Heart Rate 73  Resp 16  SpO2 99 %  Temp 97.9 F (36.6 C)    Physical Exam  Nursing note and vitals reviewed. Constitutional: She appears well-developed and well-nourished. She does not appear distressed,  does not appear ill and no respiratory distress.  HENT:  Head: Normocephalic and atraumatic.  Right Ear: Normal external ear.  Left Ear: Normal external ear.  Mouth/Throat: Voice normal.  Eyes: EOM are intact. Conjunctivae are normal. Pupils are equal, round, and reactive to light.  Neck: Normal range of motion and voice normal. Neck supple.  Cardiovascular: Normal rate, regular rhythm and normal heart sounds.  Pulmonary/Chest: No respiratory distress. Respiratory effort normal and breath sounds normal.  Abdominal: Soft. There is no abdominal tenderness. Abdomen not distended. Bowel sounds are normal.  Musculoskeletal: Decreased range of motion.     Cervical back: Normal range of motion and neck supple. no edema.  Neurological: She is alert and oriented to person, place, and time. Moves all extremities equally. She has normal speech.  Skin: Skin is warm. Skin is dry.  Psychiatric: She has a normal mood and affect. Her behavior is normal. Thought content normal.     ED Course   Lab results: No data to display  Imaging: No data to display   ECG: ECG Results   None                                                                        Pre-Sedation Procedures    Medical Decision Making Patient with some findings consistent with possible meralgia paresthetica as far as distribution of symptoms.  Patient has prescription for Lyrica 75 mg twice daily which she has not picked up yet.  She was given a dose to take when she gets home.  She was advised to get her medications, follow-up with orthopedics and her primary care physician and return if any worsening symptoms.  Risk Prescription drug management.     @[email or web address] @     Provider Communication  Discharge Medication List as of 10/27/2023 11:47 PM      Discharge Medication List as of 10/27/2023 11:47 PM      Discharge Medication List as of 10/27/2023 11:47 PM      Clinical  Impression Final diagnoses:  Neuropathic pain, leg, right  Meralgia paresthetica of right side - Possible    ED Disposition     ED Disposition  Discharge   Condition  Stable   Comment  --                   Electronically signed by:       [1] Social History Tobacco Use  Smoking Status Never  Smokeless Tobacco Never  [2] Allergies Allergen Reactions  . Latex Itching  .  Ondansetron  Hcl Palpitations  . Sulfa Antibiotics Itching  . Amoxicillin Nausea And Vomiting  . Augmentin Itching, Other and Hives  . Bactrim [Sulfamethoxazole-Trimethoprim] Nausea And Vomiting  . Cephalexin Nausea And Vomiting and Other    Derm   . Clarithromycin Itching, Unknown and Other  . Doxycycline Nausea And Vomiting and Other    06/13/21 patient states can take antibiotic without adverse effect  . Keflex Nausea And Vomiting, Other and Unknown  . Moxifloxacin Abdominal Pain, Nausea And Vomiting, Other and Hives  . Penicillins Nausea And Vomiting, Unknown and Abdominal Pain  . Tamsulosin Other    Vision changes  . Tetracycline Other  . Zofran  [Ondansetron ] Agitation  . Gabapentin Nausea Only and Abdominal Pain    Fatigue  . Hydrocodone  Nausea Only and Abdominal Pain  . Metronidazole Abdominal Pain  . Tetracyclines & Related Nausea Only and Abdominal Pain   Glendia CHRISTELLA Jude, MD 10/28/23 [phone]

## 2023-12-28 ENCOUNTER — Encounter (HOSPITAL_BASED_OUTPATIENT_CLINIC_OR_DEPARTMENT_OTHER): Payer: Self-pay | Admitting: Emergency Medicine

## 2023-12-28 ENCOUNTER — Other Ambulatory Visit: Payer: Self-pay

## 2023-12-28 ENCOUNTER — Emergency Department (HOSPITAL_BASED_OUTPATIENT_CLINIC_OR_DEPARTMENT_OTHER)

## 2023-12-28 ENCOUNTER — Emergency Department (HOSPITAL_BASED_OUTPATIENT_CLINIC_OR_DEPARTMENT_OTHER)
Admission: EM | Admit: 2023-12-28 | Discharge: 2023-12-29 | Disposition: A | Attending: Emergency Medicine | Admitting: Emergency Medicine

## 2023-12-28 DIAGNOSIS — R319 Hematuria, unspecified: Secondary | ICD-10-CM | POA: Insufficient documentation

## 2023-12-28 DIAGNOSIS — K5900 Constipation, unspecified: Secondary | ICD-10-CM | POA: Insufficient documentation

## 2023-12-28 LAB — CBC WITH DIFFERENTIAL/PLATELET
Abs Immature Granulocytes: 0.01 K/uL (ref 0.00–0.07)
Basophils Absolute: 0.1 K/uL (ref 0.0–0.1)
Basophils Relative: 1 %
Eosinophils Absolute: 0.7 K/uL — ABNORMAL HIGH (ref 0.0–0.5)
Eosinophils Relative: 7 %
HCT: 37.6 % (ref 36.0–46.0)
Hemoglobin: 12.5 g/dL (ref 12.0–15.0)
Immature Granulocytes: 0 %
Lymphocytes Relative: 55 %
Lymphs Abs: 6 K/uL — ABNORMAL HIGH (ref 0.7–4.0)
MCH: 29.4 pg (ref 26.0–34.0)
MCHC: 33.2 g/dL (ref 30.0–36.0)
MCV: 88.5 fL (ref 80.0–100.0)
Monocytes Absolute: 0.7 K/uL (ref 0.1–1.0)
Monocytes Relative: 6 %
Neutro Abs: 3.4 K/uL (ref 1.7–7.7)
Neutrophils Relative %: 31 %
Platelets: 275 K/uL (ref 150–400)
RBC: 4.25 MIL/uL (ref 3.87–5.11)
RDW: 14.8 % (ref 11.5–15.5)
WBC: 10.8 K/uL — ABNORMAL HIGH (ref 4.0–10.5)
nRBC: 0 % (ref 0.0–0.2)

## 2023-12-28 LAB — COMPREHENSIVE METABOLIC PANEL WITH GFR
ALT: 15 U/L (ref 0–44)
AST: 29 U/L (ref 15–41)
Albumin: 4.1 g/dL (ref 3.5–5.0)
Alkaline Phosphatase: 81 U/L (ref 38–126)
Anion gap: 12 (ref 5–15)
BUN: 13 mg/dL (ref 8–23)
CO2: 25 mmol/L (ref 22–32)
Calcium: 9.1 mg/dL (ref 8.9–10.3)
Chloride: 106 mmol/L (ref 98–111)
Creatinine, Ser: 0.59 mg/dL (ref 0.44–1.00)
GFR, Estimated: >60 mL/min (ref >60)
Glucose, Bld: 88 mg/dL (ref 70–99)
Potassium: 3.6 mmol/L (ref 3.5–5.1)
Sodium: 143 mmol/L (ref 135–145)
Total Bilirubin: 0.2 mg/dL (ref 0.0–1.2)
Total Protein: 6.8 g/dL (ref 6.5–8.1)

## 2023-12-28 LAB — LIPASE, BLOOD: Lipase: 40 U/L (ref 11–51)

## 2023-12-28 MED ORDER — DOCUSATE SODIUM 100 MG PO CAPS: 100.0000 mg | ORAL_CAPSULE | Freq: Two times a day (BID) | ORAL | 0 refills | Status: AC

## 2023-12-28 MED ORDER — IOHEXOL 300 MG/ML  SOLN
100.0000 mL | Freq: Once | INTRAMUSCULAR | Status: AC | PRN
  Administered 2023-12-28: 100 mL via INTRAVENOUS

## 2023-12-28 MED ORDER — LACTULOSE 10 GM/15ML PO SOLN: 10.0000 g | Freq: Two times a day (BID) | ORAL | 0 refills | Status: AC | PRN

## 2023-12-28 NOTE — ED Provider Notes (Signed)
 [place] [facility] Provider Note   CSN: [id number] Arrival date & time: 12/28/23  2216     Patient presents with: Constipation   Sherry Randall is a 68 y.o. female   Presenting to ED complaining of constipation for 5 days, which she says is very atypical for her, despite Dulcolax use.  She reports she is passing gas, no nausea or vomiting.  Feels her abdomen is bloated.  She says she has also had very sharp pain in her lower suprapubic region.  She went to an urgent care yesterday and was prescribed ciprofloxacin for possible UTI.  However she is concerned that maybe she is having a kidney stone, as there is blood in her urine, she has had kidney stones in the past.  She is not taking any narcotics.  Denies history of abdominal surgery   HPI     Prior to Admission medications   Medication Sig Start Date End Date Taking? Authorizing Provider  BIOTIN PO Take 1 tablet by mouth daily.    [provider]  bismuth-metronidazole-tetracycline [facility]) 140-125-125 MG capsule Take 1 capsule by mouth 2 (two) times daily.    [provider]  cholecalciferol (VITAMIN D) 1000 units tablet Take 1,000 Units by mouth daily.    [provider]  cyclobenzaprine  (FLEXERIL ) 5 MG tablet Take 1 tablet (5 mg total) by mouth 2 (two) times daily as needed for muscle spasms. 06/24/19   Horton, Charmaine FALCON, MD  metoCLOPramide  (REGLAN ) 5 MG tablet Take 1 tablet (5 mg total) by mouth every 6 (six) hours as needed (headache). 09/21/21   Elnor Jayson LABOR, DO  naproxen  (NAPROSYN ) 500 MG tablet Take 1 tablet (500 mg total) by mouth 2 (two) times daily. 06/24/19   Horton, Charmaine FALCON, MD  Omega-3 Fatty Acids (FISH OIL) 1000 MG CAPS Take 1 capsule by mouth daily.    [provider]  ondansetron  (ZOFRAN  ODT) 4 MG disintegrating tablet Take 1 tablet (4 mg total) by mouth every 8 (eight) hours as needed for nausea or vomiting. 03/07/20   Rancour, Garnette, MD   potassium chloride  (KLOR-CON ) 10 MEQ tablet Take 1 tablet (10 mEq total) by mouth daily for 3 days. 09/21/21 09/24/21  Elnor Jayson LABOR, DO  promethazine  (PHENERGAN ) 25 MG tablet Take 1 tablet (25 mg total) by mouth every 6 (six) hours as needed for up to 20 doses for nausea or vomiting. 06/26/20   Curatolo, Adam, DO    Allergies: Sulfa antibiotics, Augmentin [amoxicillin-pot clavulanate], Avelox [moxifloxacin hcl in nacl], Biaxin [clarithromycin], Hydrocodone , Keflet [cephalexin], and Tetracyclines & related    Review of Systems  Updated Vital Signs BP (!) 163/82   Pulse 84   Temp 97.7 F (36.5 C)   Resp 17   Ht 5' 6 (1.676 m)   Wt 48.1 kg   SpO2 100%   BMI 17.11 kg/m   Physical Exam Constitutional:      General: She is not in acute distress. HENT:     Head: Normocephalic and atraumatic.  Eyes:     Conjunctiva/sclera: Conjunctivae normal.     Pupils: Pupils are equal, round, and reactive to light.  Cardiovascular:     Rate and Rhythm: Normal rate and regular rhythm.  Pulmonary:     Effort: Pulmonary effort is normal. No respiratory distress.  Abdominal:     General: There is no distension.     Tenderness: There is no abdominal tenderness.  Skin:    General: Skin is  warm and dry.  Neurological:     General: No focal deficit present.     Mental Status: She is alert. Mental status is at baseline.  Psychiatric:        Mood and Affect: Mood normal.        Behavior: Behavior normal.     (all labs ordered are listed, but only abnormal results are displayed) Labs Reviewed  CBC WITH DIFFERENTIAL/PLATELET - Abnormal; Notable for the following components:      Result Value   WBC 10.8 (*)    Lymphs Abs 6.0 (*)    Eosinophils Absolute 0.7 (*)    All other components within normal limits  COMPREHENSIVE METABOLIC PANEL WITH GFR  LIPASE, BLOOD  URINALYSIS, ROUTINE W REFLEX MICROSCOPIC    EKG: None  Radiology: No results found.   Procedures   Medications Ordered in the  ED - No data to display                                  Medical Decision Making Amount and/or Complexity of Data Reviewed Labs: ordered. Radiology: ordered.   Differential could include ureteral colic versus UTI versus functional constipation versus colitis versus pancreatitis versus other  Patient is overall well-appearing clinically on exam.  Low suspicion for GI perforation, sepsis, mesenteric ischemia, or other life-threatening emergency at this time.  Patient's labs and imaging are pending at the time of signout to Dr Haze EDP at 11 am     Final diagnoses:  Hematuria, unspecified type  Constipation, unspecified constipation type    ED Discharge Orders     None          Cottie Donnice PARAS, MD 12/28/23 2304

## 2023-12-28 NOTE — ED Triage Notes (Signed)
 Pt reports UTI since yesterday, started antibiotic yesterday, ongoing dysuria. Also c/o ongoing pain in groin.   C/o constipation x 5 days, reports difficulty having BM due to recent injury.

## 2023-12-28 NOTE — ED Provider Notes (Signed)
 Patient signed out to me by Dr. Cottie.  Patient with CT pending.  Patient has been experiencing symptoms of constipation for the last 5 days which are unusual.  She has been using Dulcolax without improvement.  She feels bloated.  Patient was seen at urgent care and started on Cipro for possible urinary tract infection as she has been having some urinary symptoms as well.  CT scan has been performed.  There are no inflammatory changes that would suggest diverticulitis.  No obstructive ureteral calculi.  Patient does have evidence of constipation which is likely her presentation cause.  Would recommend continuing the Cipro, as she has already initiated and may be partially treated.  In addition, however, will treat with additional laxatives, stool softeners, primary care follow-up.   Haze Lonni PARAS, MD 12/28/23 [phone]

## 2023-12-28 NOTE — ED Notes (Signed)
 Patient transferred from waiting room to ED treatment room. Assuming pt care at this time.

## 2023-12-28 NOTE — ED Notes (Signed)
 Pt aware of necessity of urine sample.

## 2023-12-29 LAB — URINALYSIS, ROUTINE W REFLEX MICROSCOPIC
Bilirubin Urine: NEGATIVE
Glucose, UA: NEGATIVE mg/dL
Ketones, ur: NEGATIVE mg/dL
Leukocytes,Ua: NEGATIVE
Nitrite: NEGATIVE
Protein, ur: NEGATIVE mg/dL
Specific Gravity, Urine: 1.01 (ref 1.005–1.030)
pH: 7 (ref 5.0–8.0)

## 2023-12-29 LAB — URINALYSIS, MICROSCOPIC (REFLEX)

## 2024-02-09 ENCOUNTER — Emergency Department (HOSPITAL_BASED_OUTPATIENT_CLINIC_OR_DEPARTMENT_OTHER)
Admission: EM | Admit: 2024-02-09 | Discharge: 2024-02-09 | Disposition: A | Discharge status: Home / Self Care | Attending: Emergency Medicine | Admitting: Emergency Medicine

## 2024-02-09 ENCOUNTER — Other Ambulatory Visit: Payer: Self-pay

## 2024-02-09 ENCOUNTER — Encounter (HOSPITAL_BASED_OUTPATIENT_CLINIC_OR_DEPARTMENT_OTHER): Payer: Self-pay

## 2024-02-09 DIAGNOSIS — K1379 Other lesions of oral mucosa: Secondary | ICD-10-CM | POA: Diagnosis not present

## 2024-02-09 DIAGNOSIS — K0889 Other specified disorders of teeth and supporting structures: Secondary | ICD-10-CM | POA: Diagnosis present

## 2024-02-09 DIAGNOSIS — Z87442 Personal history of urinary calculi: Secondary | ICD-10-CM | POA: Diagnosis not present

## 2024-02-09 MED ORDER — ALUM & MAG HYDROXIDE-SIMETH 200-200-20 MG/5ML PO SUSP
30.0000 mL | Freq: Once | ORAL | Status: AC
  Administered 2024-02-09: 30 mL via ORAL
  Filled 2024-02-09: qty 30

## 2024-02-09 NOTE — ED Provider Notes (Signed)
 " [place] [facility] Provider Note   CSN: [id number] Arrival date & time: 02/09/24  0000     Patient presents with: Oral Discomfort   [name] Larrick is a 69 y.o. female.   The history is provided by the patient.  Dental Pain Toothache location: between teeth lips and tongue. Onset quality:  Sudden Duration:  2 days Timing:  Constant Progression:  Unchanged Chronicity:  New Context comment:  Had dental cleaning and had pain with cleaning products used.  called dentist for guidance but had to hang up due to another call coming in Previous work-up:  Dental exam Relieved by:  Nothing Worsened by:  Nothing Ineffective treatments:  None tried Associated symptoms: no drooling, no facial pain, no facial swelling, no fever, no gum swelling, no neck swelling, no oral bleeding, no oral lesions and no trismus       Past Medical History:  Diagnosis Date   Back problem    H pylori ulcer    Kidney stone      Prior to Admission medications  Medication Sig Start Date End Date Taking? Authorizing Provider  BIOTIN PO Take 1 tablet by mouth daily.    [provider]  bismuth-metronidazole-tetracycline [facility]) 140-125-125 MG capsule Take 1 capsule by mouth 2 (two) times daily.    [provider]  cholecalciferol (VITAMIN D) 1000 units tablet Take 1,000 Units by mouth daily.    [provider]  cyclobenzaprine  (FLEXERIL ) 5 MG tablet Take 1 tablet (5 mg total) by mouth 2 (two) times daily as needed for muscle spasms. 06/24/19   Horton, Charmaine FALCON, MD  docusate sodium  (COLACE) 100 MG capsule Take 1 capsule (100 mg total) by mouth every 12 (twelve) hours. 12/28/23   Haze Lonni PARAS, MD  lactulose  (CHRONULAC ) 10 GM/15ML solution Take 15 mLs (10 g total) by mouth 2 (two) times daily as needed for moderate constipation or severe constipation. 12/28/23   Haze Lonni PARAS, MD  metoCLOPramide  (REGLAN ) 5 MG tablet Take 1 tablet (5  mg total) by mouth every 6 (six) hours as needed (headache). 09/21/21   Elnor Jayson LABOR, DO  naproxen  (NAPROSYN ) 500 MG tablet Take 1 tablet (500 mg total) by mouth 2 (two) times daily. 06/24/19   Horton, Charmaine FALCON, MD  Omega-3 Fatty Acids (FISH OIL) 1000 MG CAPS Take 1 capsule by mouth daily.    [provider]  ondansetron  (ZOFRAN  ODT) 4 MG disintegrating tablet Take 1 tablet (4 mg total) by mouth every 8 (eight) hours as needed for nausea or vomiting. 03/07/20   Rancour, Garnette, MD  potassium chloride  (KLOR-CON ) 10 MEQ tablet Take 1 tablet (10 mEq total) by mouth daily for 3 days. 09/21/21 09/24/21  Elnor Jayson A, DO  promethazine  (PHENERGAN ) 25 MG tablet Take 1 tablet (25 mg total) by mouth every 6 (six) hours as needed for up to 20 doses for nausea or vomiting. 06/26/20   Curatolo, Adam, DO    Allergies: Sulfa antibiotics, Augmentin [amoxicillin-pot clavulanate], Avelox [moxifloxacin hcl in nacl], Biaxin [clarithromycin], Hydrocodone , Keflet [cephalexin], and Tetracyclines & related    Review of Systems  Constitutional:  Negative for fever.  HENT:  Negative for drooling, facial swelling and mouth sores.   All other systems reviewed and are negative.   Updated Vital Signs BP 138/73 (BP Location: Right Arm)   Pulse 69   Temp (!) 97.4 F (36.3 C) (Oral)   Resp 16   Ht 5' 6 (1.676 m)  Wt 55 kg   SpO2 100%   BMI 19.57 kg/m   Physical Exam Vitals and nursing note reviewed.  Constitutional:      General: She is not in acute distress.    Appearance: She is well-developed.  HENT:     Head: Normocephalic and atraumatic.     Nose: Nose normal.     Mouth/Throat:     Mouth: Mucous membranes are moist.     Pharynx: Oropharynx is clear. No oropharyngeal exudate or posterior oropharyngeal erythema.     Comments: No swelling, no lesions no trismus.  No swelling of the lips tongue of uvula  Eyes:     Pupils: Pupils are equal, round, and reactive to light.  Cardiovascular:     Rate  and Rhythm: Normal rate and regular rhythm.     Pulses: Normal pulses.     Heart sounds: Normal heart sounds.  Pulmonary:     Effort: Pulmonary effort is normal. No respiratory distress.     Breath sounds: Normal breath sounds.  Abdominal:     General: Bowel sounds are normal. There is no distension.     Palpations: Abdomen is soft.     Tenderness: There is no abdominal tenderness. There is no guarding or rebound.  Musculoskeletal:        General: Normal range of motion.     Cervical back: Neck supple.  Skin:    General: Skin is warm and dry.     Capillary Refill: Capillary refill takes less than 2 seconds.     Findings: No erythema or rash.  Neurological:     General: No focal deficit present.     Deep Tendon Reflexes: Reflexes normal.  Psychiatric:        Mood and Affect: Mood normal.     (all labs ordered are listed, but only abnormal results are displayed) Labs Reviewed - No data to display  EKG: None  Radiology: No results found.   Procedures   Medications Ordered in the ED  alum & mag hydroxide-simeth (MAALOX/MYLANTA) 200-200-20 MG/5ML suspension 30 mL (30 mLs Oral Given 02/09/24 0039)                                    Medical Decision Making Patient with oral pain post dental cleaning   Amount and/or Complexity of Data Reviewed External Data Reviewed: notes.    Details: Previous notes reviewed   Risk OTC drugs. Risk Details: No lesions nor swelling of the mouth or lips.  No signs of allergic reaction.  Have given GI cocktail.  Advised biotene mouth rinse to soothe and close follow up with your dentist for ongoing care.  Stable for discharge.     Final diagnoses:  Oral pain   No signs of systemic illness or infection. The patient is nontoxic-appearing on exam and vital signs are within normal limits.  I have reviewed the triage vital signs and the nursing notes. Pertinent labs & imaging results that were available during my care of the patient were  reviewed by me and considered in my medical decision making (see chart for details). After history, exam, and medical workup I feel the patient has been appropriately medically screened and is safe for discharge home. Pertinent diagnoses were discussed with the patient. Patient was given return precautions.  ED Discharge Orders     None          Hughie Melroy,  MD 02/09/24 0046  "

## 2024-02-09 NOTE — ED Triage Notes (Signed)
 Sx x yesterday. Patient reports possible allergic reaction to dental cleaning solution sp dentist appointment yesterday. Patient reports oral pain and chapped lips. Airway is patent. NAD noted. Patient speaking in complete sentences.

## 2024-02-09 NOTE — Discharge Instructions (Addendum)
 Biotene mouth rinse may help with oral discomfort.
